# Patient Record
Sex: Male | Born: 1960
Health system: Southern US, Community
[De-identification: ages and names within clinical notes are randomized; demographics above are authoritative.]

## PROBLEM LIST (undated history)

## (undated) DIAGNOSIS — G709 Myoneural disorder, unspecified: Secondary | ICD-10-CM

## (undated) DIAGNOSIS — E785 Hyperlipidemia, unspecified: Secondary | ICD-10-CM

## (undated) DIAGNOSIS — K219 Gastro-esophageal reflux disease without esophagitis: Secondary | ICD-10-CM

## (undated) DIAGNOSIS — I10 Essential (primary) hypertension: Secondary | ICD-10-CM

## (undated) HISTORY — DX: Essential (primary) hypertension: I10

## (undated) HISTORY — DX: Gastro-esophageal reflux disease without esophagitis: K21.9

## (undated) HISTORY — DX: Hyperlipidemia, unspecified: E78.5

## (undated) HISTORY — DX: Myoneural disorder, unspecified: G70.9

---

## 2006-04-04 ENCOUNTER — Ambulatory Visit: Payer: Self-pay | Admitting: Family Medicine

## 2006-11-07 ENCOUNTER — Ambulatory Visit: Payer: Self-pay | Admitting: Family Medicine

## 2008-02-18 IMAGING — US ABDOMEN ULTRASOUND
1 series · 17 of 25 positions shown · non-contrast
Comparison: none

REASON FOR EXAM: RUQ pain nausea vomiting   CALL report  [id number]
COMMENTS:

[Series 1: abdomen ultrasound · 17 of 58 slices shown]
[im 1/58]
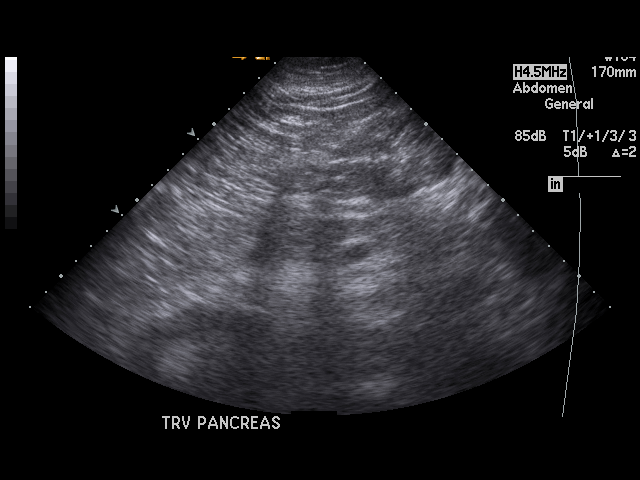
[im 5/58]
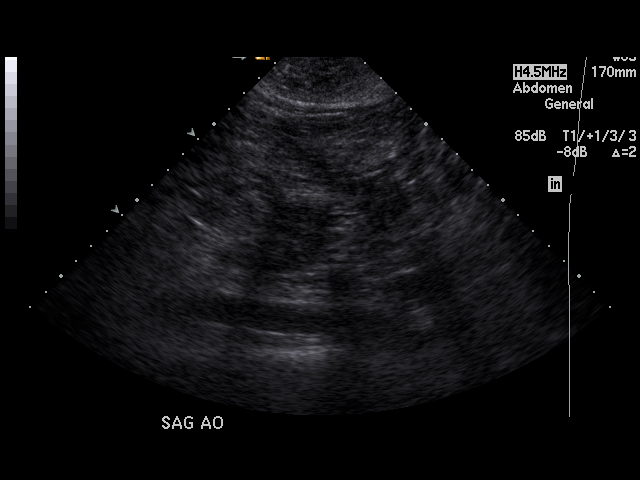
[im 8/58]
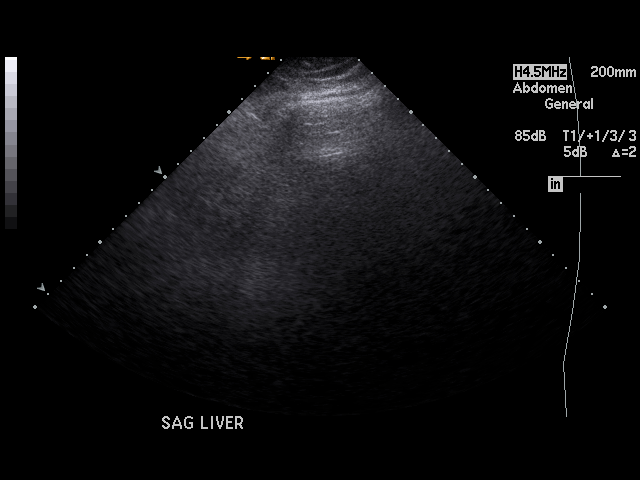
[im 12/58]
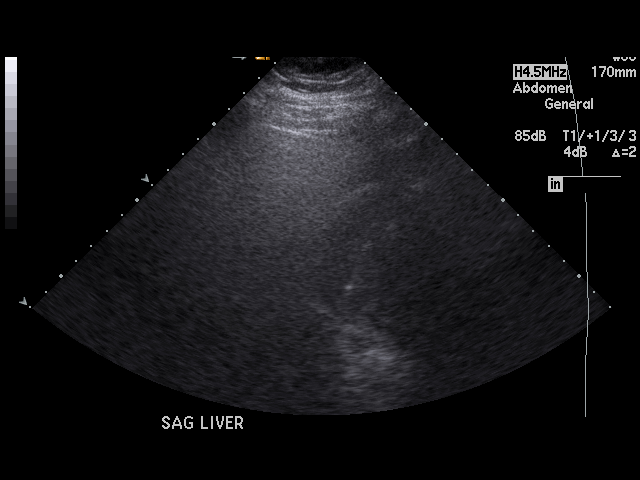
[im 15/58]
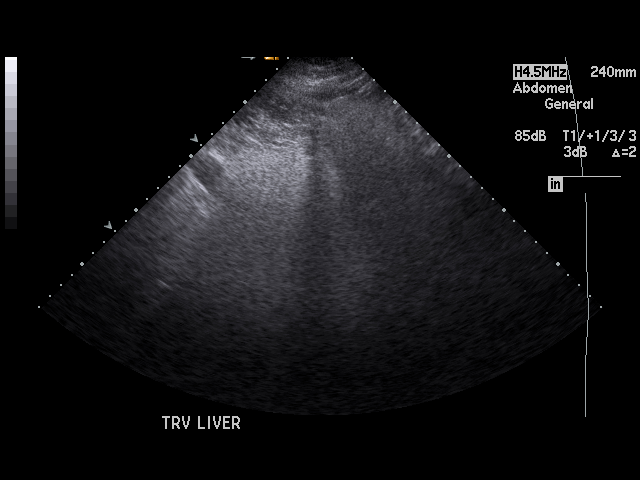
[im 20/58]
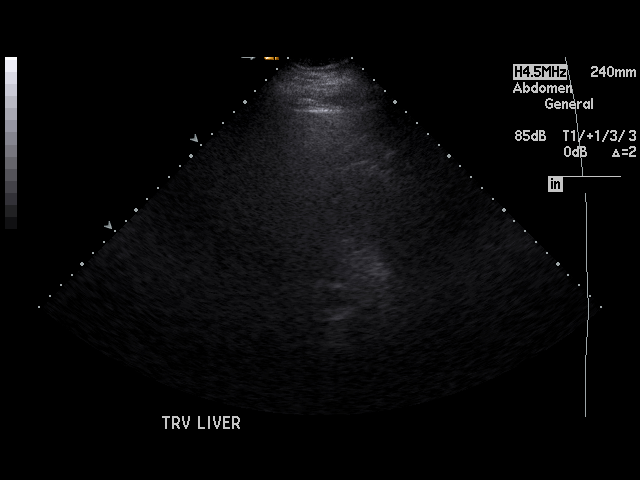
[im 22/58]
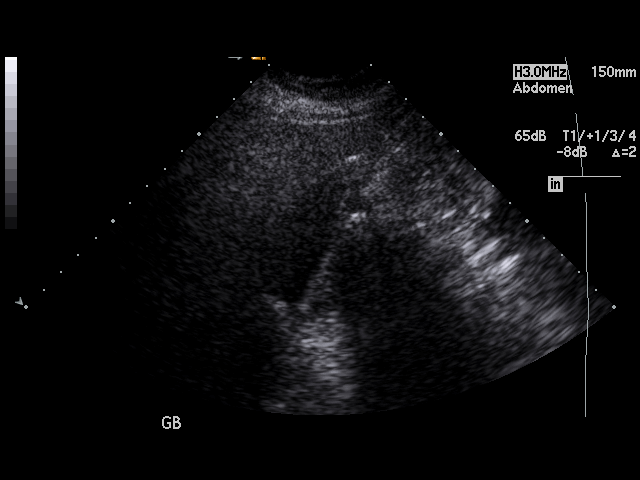
[im 27/58]
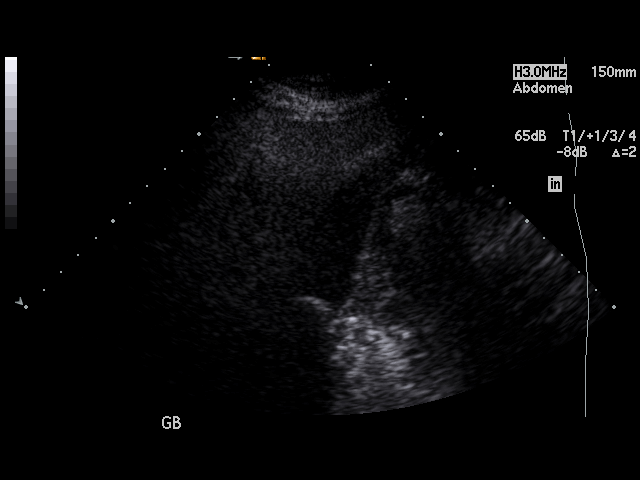
[im 29/58]
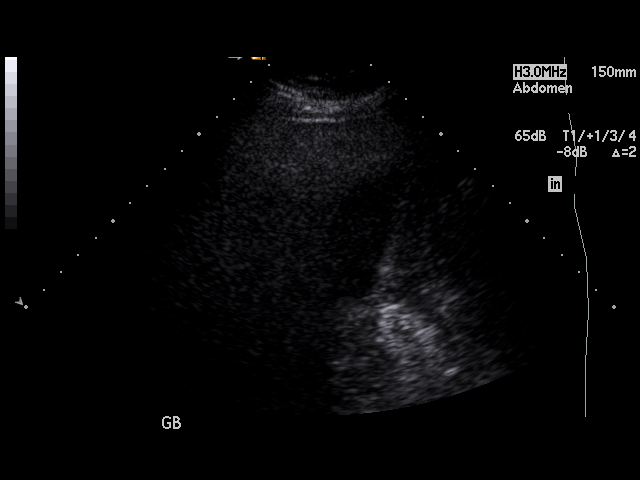
[im 31/58]
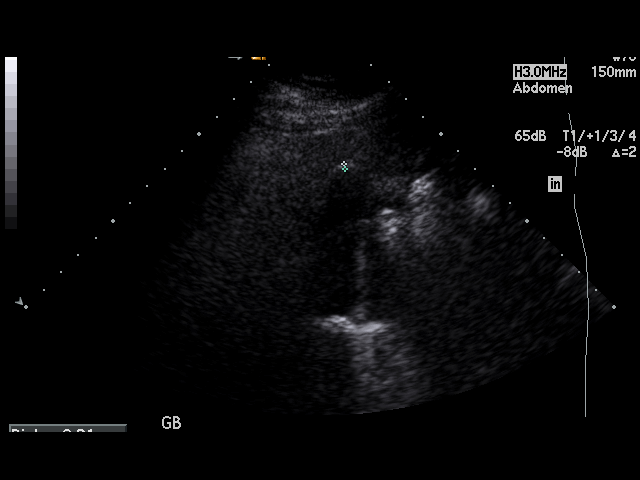
[im 36/58]
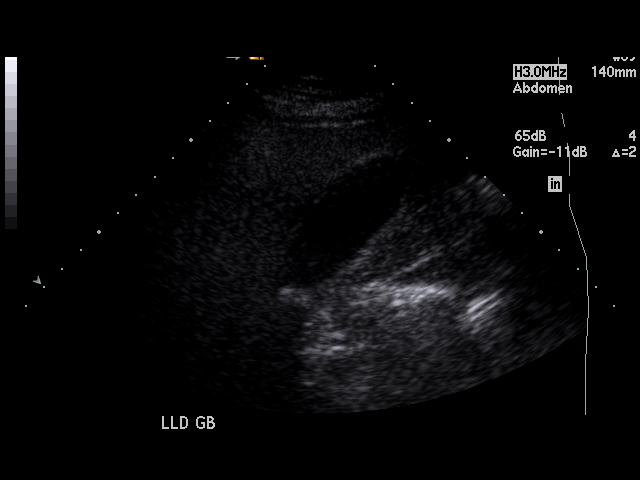
[im 39/58]
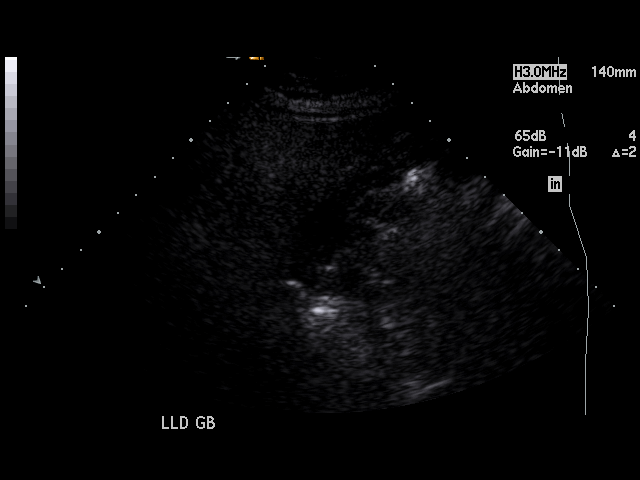
[im 43/58]
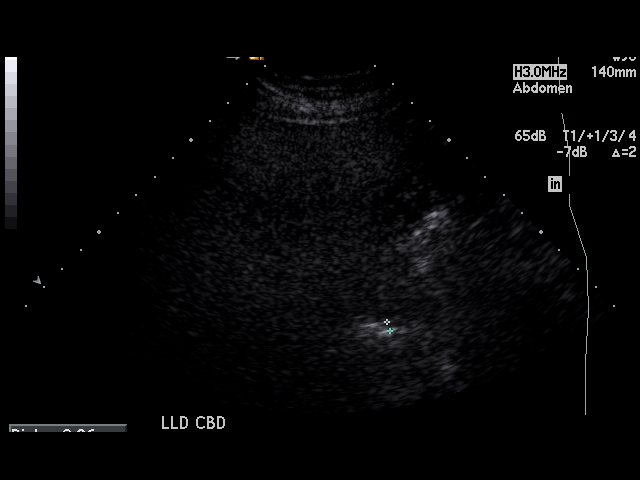
[im 46/58]
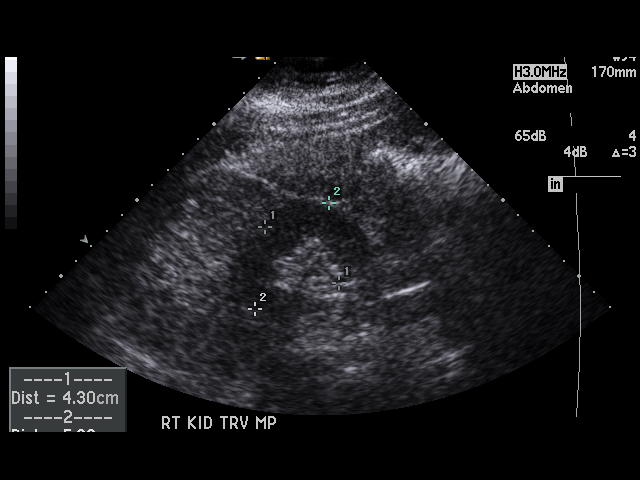
[im 50/58]
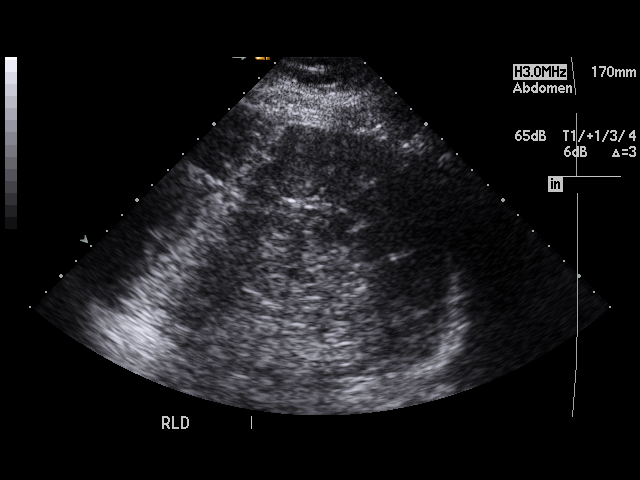
[im 53/58]
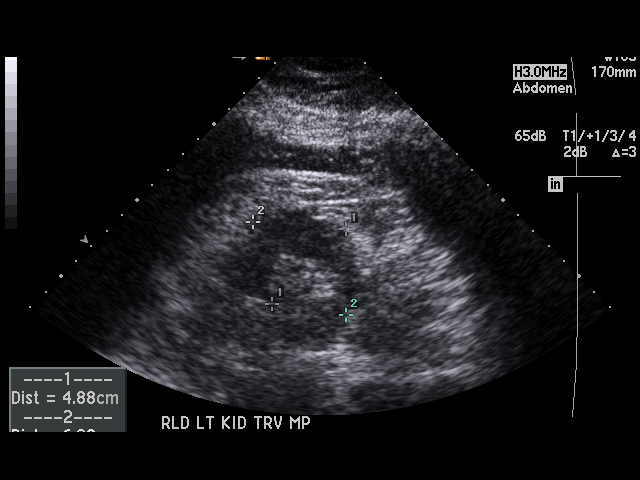
[im 58/58]
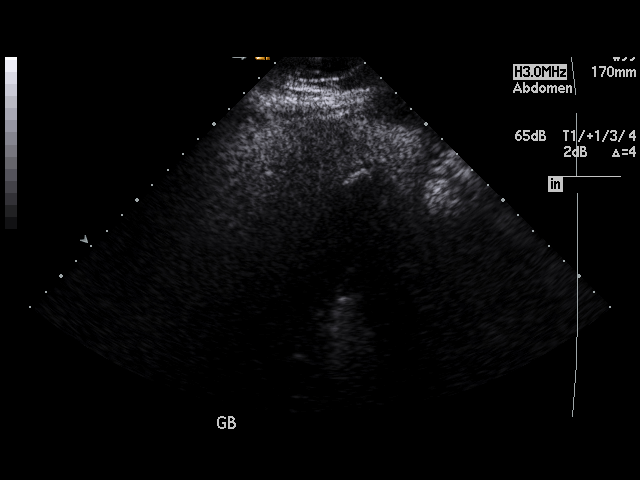

[17 of 25 positions shown; findings below may reference images not displayed]

PROCEDURE:     US  - US ABDOMEN GENERAL SURVEY  - November 07, 2006 [DATE]

RESULT:     The liver is dense, compatible with fatty infiltration. No focal
hepatic mass lesions are seen. The spleen is normal in size. The pancreas is
not visualized adequately for evaluation. The abdominal aorta is normal in
appearance. There is observed a mobile 1.0 cm echo density in the
gallbladder compatible with a gallstone. There is no thickening of the
gallbladder wall. The common bile duct measures 3.6 mm in diameter which is
within normal limits. The kidneys show no hydronephrosis. There is no
ascites.
IMPRESSION: 1. Cholelithiasis.
2. Fatty infiltration of the liver.
3. The pancreas is not visualized adequately for evaluation on this exam.

## 2010-07-20 LAB — HM COLONOSCOPY: HM COLON: ABNORMAL — AB

## 2014-08-17 ENCOUNTER — Other Ambulatory Visit: Payer: Self-pay | Admitting: Family Medicine

## 2014-11-26 LAB — PSA: PSA: 2.3

## 2015-09-09 DIAGNOSIS — I10 Essential (primary) hypertension: Secondary | ICD-10-CM | POA: Diagnosis not present

## 2015-10-10 ENCOUNTER — Encounter: Payer: Self-pay | Admitting: Family Medicine

## 2015-10-10 ENCOUNTER — Ambulatory Visit (INDEPENDENT_AMBULATORY_CARE_PROVIDER_SITE_OTHER): Payer: BLUE CROSS/BLUE SHIELD | Admitting: Family Medicine

## 2015-10-10 VITALS — BP 124/84 | HR 100 | Temp 98.9°F | Resp 18 | Ht 67.0 in | Wt 229.1 lb

## 2015-10-10 DIAGNOSIS — R739 Hyperglycemia, unspecified: Secondary | ICD-10-CM

## 2015-10-10 DIAGNOSIS — Z0000 Encounter for general adult medical examination without abnormal findings: Secondary | ICD-10-CM

## 2015-10-10 DIAGNOSIS — I10 Essential (primary) hypertension: Secondary | ICD-10-CM

## 2015-10-10 DIAGNOSIS — K760 Fatty (change of) liver, not elsewhere classified: Secondary | ICD-10-CM | POA: Diagnosis not present

## 2015-10-10 DIAGNOSIS — R809 Proteinuria, unspecified: Secondary | ICD-10-CM

## 2015-10-10 DIAGNOSIS — N400 Enlarged prostate without lower urinary tract symptoms: Secondary | ICD-10-CM | POA: Diagnosis not present

## 2015-10-10 DIAGNOSIS — E663 Overweight: Secondary | ICD-10-CM | POA: Insufficient documentation

## 2015-10-10 DIAGNOSIS — E559 Vitamin D deficiency, unspecified: Secondary | ICD-10-CM

## 2015-10-10 DIAGNOSIS — E785 Hyperlipidemia, unspecified: Secondary | ICD-10-CM

## 2015-10-10 DIAGNOSIS — L309 Dermatitis, unspecified: Secondary | ICD-10-CM | POA: Insufficient documentation

## 2015-10-10 DIAGNOSIS — Z1159 Encounter for screening for other viral diseases: Secondary | ICD-10-CM

## 2015-10-10 DIAGNOSIS — L219 Seborrheic dermatitis, unspecified: Secondary | ICD-10-CM | POA: Insufficient documentation

## 2015-10-10 DIAGNOSIS — M5090 Cervical disc disorder, unspecified, unspecified cervical region: Secondary | ICD-10-CM | POA: Insufficient documentation

## 2015-10-10 DIAGNOSIS — K8020 Calculus of gallbladder without cholecystitis without obstruction: Secondary | ICD-10-CM | POA: Insufficient documentation

## 2015-10-10 LAB — POCT UA - MICROALBUMIN: Microalbumin Ur, POC: 20 mg/L

## 2015-10-10 LAB — HEMOGLOBIN A1C
Hgb A1c MFr Bld: 6.1 % — ABNORMAL HIGH (ref <5.7)
MEAN PLASMA GLUCOSE: 128 mg/dL

## 2015-10-10 MED ORDER — ASPIRIN 81 MG PO CHEW: 81.0000 mg | CHEWABLE_TABLET | Freq: Once | ORAL | Status: DC

## 2015-10-10 MED ORDER — LISINOPRIL 10 MG PO TABS: 10.0000 mg | ORAL_TABLET | Freq: Every day | ORAL | 1 refills | Status: DC

## 2015-10-10 NOTE — Progress Notes (Signed)
Name: Melvin Williams   MRN: [id number]    DOB: 1960-09-24   Date:10/10/2015       Progress Note  Subjective  Chief Complaint  Chief Complaint  Patient presents with  . Annual Exam    HPI  Well male exam: not sexually active for the past 14 years, he has nocturia since age 55.  Hyperglycemia: he has gained a lot of weight in the past 2 years, 43 lbs, he states he likes to eat. He denies polydipsia.   HTN: he missed appointments and was without bp medication for months. His bp was going up to 180's , he went to Urgent Care and was placed back on Lisinopril - bp has been back to normal since. He was having some left side chest pain about 6 weeks ago - lasting a second, not associated with diaphoresis or SOB. Symptoms resolved since started back on bp medication. He does not want to see a cardiologist at this time. He wants to hold off on eKG also  Obesity: he was very strict with his diet when he was having abdominal pain from cholelithiasis years ago, however he has gained 43 lbs since. Discussed importance of life style modification.    Patient Active Problem List   Diagnosis Date Noted  . Hypertension, benign 10/10/2015  . Hyperlipidemia 10/10/2015  . Hyperglycemia 10/10/2015  . Vitamin D deficiency 10/10/2015  . Microalbuminuria 10/10/2015  . Morbid obesity ([place]) 10/10/2015  . Eczema 10/10/2015  . Seborrhea 10/10/2015  . Fatty liver 10/10/2015  . Cholelithiasis 10/10/2015  . Cervical disc disease 10/10/2015    History reviewed. No pertinent surgical history.  Family History  Problem Relation Age of Onset  . Cancer Mother   . Heart disease Father     Social History   Social History  . Marital status: Single    Spouse name: N/A  . Number of children: N/A  . Years of education: N/A   Occupational History  . Not on file.   Social History Main Topics  . Smoking status: Never Smoker  . Smokeless tobacco: Never Used  . Alcohol use No  . Drug use: No  . Sexual  activity: No   Other Topics Concern  . Not on file   Social History Narrative  . No narrative on file     Current Outpatient Prescriptions:  .  cholecalciferol (VITAMIN D) 1000 units tablet, Take 1,000 Units by mouth daily., Disp: , Rfl:  .  aspirin EC 81 MG tablet, Take by mouth., Disp: , Rfl:  .  lisinopril (PRINIVIL,ZESTRIL) 10 MG tablet, Take 1 tablet (10 mg total) by mouth daily., Disp: 90 tablet, Rfl: 1  No Known Allergies   ROS  Constitutional: Negative for fever , positive for weight change.  Respiratory: Negative for cough and shortness of breath.   Cardiovascular: Negative for chest pain or palpitations. He states he was having a brief ( like a second- like a twinge on his chest ) Gastrointestinal: Negative for abdominal pain, no bowel changes.  Musculoskeletal: Negative for gait problem or joint swelling.  Skin: positive  for rash.  Neurological: Negative for dizziness or headache.  No other specific complaints in a complete review of systems (except as listed in HPI above).  Objective  Vitals:   10/10/15 1114  BP: 124/84  Pulse: 100  Resp: 18  Temp: 98.9 F (37.2 C)  SpO2: 98%  Weight: 229 lb 1 oz (103.9 kg)  Height: 5\' 7"  (1.702 m)  Body mass index is 35.88 kg/m.  Physical Exam  Constitutional: Patient appears well-developed and well-nourished. No distress.  HENT: Head: Normocephalic and atraumatic. Ears: B TMs ok, no erythema or effusion; Nose: Nose normal. Mouth/Throat: Oropharynx is clear and moist. No oropharyngeal exudate.  Eyes: Conjunctivae and EOM are normal. Pupils are equal, round, and reactive to light. No scleral icterus.  Neck: Normal range of motion. Neck supple. No JVD present. No thyromegaly present.  Cardiovascular: Normal rate, regular rhythm and normal heart sounds.  No murmur heard. No BLE edema. Pulmonary/Chest: Effort normal and breath sounds normal. No respiratory distress. Abdominal: Soft. Bowel sounds are normal, no  distension. There is no tenderness. no masses MALE GENITALIA: Normal descended testes bilaterally, no masses palpated, no hernias, no lesions, no discharge RECTAL: Prostate enlarged in  size normal  consistency, no rectal masses or hemorrhoids Musculoskeletal: Normal range of motion, no joint effusions. No gross deformities Neurological: he is alert and oriented to person, place, and time. No cranial nerve deficit. Coordination, balance, strength, speech and gait are normal.  Skin: Skin is warm and dry. No rash noted. No erythema.  Psychiatric: Patient has a normal mood and affect. behavior is normal. Judgment and thought content normal.  PHQ2/9: Depression screen [facility] 2/9 10/10/2015  Decreased Interest 0  Down, Depressed, Hopeless 0  PHQ - 2 Score 0  Altered sleeping 3  Tired, decreased energy 1  Change in appetite 3  Feeling bad or failure about yourself  0  Trouble concentrating 0  Moving slowly or fidgety/restless 0  Suicidal thoughts 0  PHQ-9 Score 7     Functional Status Survey: Is the patient deaf or have difficulty hearing?: No Does the patient have difficulty seeing, even when wearing glasses/contacts?: No Does the patient have difficulty concentrating, remembering, or making decisions?: No Does the patient have difficulty walking or climbing stairs?: No Does the patient have difficulty dressing or bathing?: No Does the patient have difficulty doing errands alone such as visiting a doctor's office or shopping?: No  IPSS Questionnaire (AUA-7): Over the past month.   1)  How often have you had a sensation of not emptying your bladder completely after you finish urinating?  5 - Almost always  2)  How often have you had to urinate again less than two hours after you finished urinating? 1 - Less than 1 time in 5  3)  How often have you found you stopped and started again several times when you urinated?  5 - Almost always  4) How difficult have you found it to postpone  urination?  2 - Less than half the time  5) How often have you had a weak urinary stream?  5 - Almost always  6) How often have you had to push or strain to begin urination?  0 - Not at all  7) How many times did you most typically get up to urinate from the time you went to bed until the time you got up in the morning?  2 - 2 times  Total score:  0-7 mildly symptomatic   8-19 moderately symptomatic   20-35 severely symptomatic     Assessment & Plan  1. Encounter for routine history and physical exam for male  Discussed importance of 150 minutes of physical activity weekly, eat two servings of fish weekly, eat one serving of tree nuts ( cashews, pistachios, pecans, almonds.Marland Kitchen) every other day, eat 6 servings of fruit/vegetables daily and drink plenty of water and avoid sweet  beverages.   2. Hypertension, benign  - lisinopril (PRINIVIL,ZESTRIL) 10 MG tablet; Take 1 tablet (10 mg total) by mouth daily.  Dispense: 90 tablet; Refill: 1 - COMPLETE METABOLIC PANEL WITH GFR  3. Hyperlipidemia  - Lipid panel  4. Hyperglycemia  - COMPLETE METABOLIC PANEL WITH GFR - Hemoglobin A1c  5. Vitamin D deficiency  Continue vitamin D supplementation   6. Microalbuminuria  - POCT UA - Microalbumin  7. Morbid obesity, unspecified obesity type [facility])  Discussed with the patient the risk posed by an increased BMI. Discussed importance of portion control, calorie counting and at least 150 minutes of physical activity weekly. Avoid sweet beverages and drink more water. Eat at least 6 servings of fruit and vegetables daily   8. Fatty liver  - COMPLETE METABOLIC PANEL WITH GFR  9. BPH (benign prostatic hyperplasia)  AUA is high, but he would like to hold off on medication, states it does not bother him, discussed symptoms of urinary retention

## 2015-10-11 LAB — LIPID PANEL
Cholesterol: 207 mg/dL — ABNORMAL HIGH (ref 125–200)
HDL: 61 mg/dL (ref >=40)
LDL CALC: 107 mg/dL (ref <130)
Total CHOL/HDL Ratio: 3.4 Ratio (ref <=5.0)
Triglycerides: 194 mg/dL — ABNORMAL HIGH (ref <150)
VLDL: 39 mg/dL — AB (ref <30)

## 2015-10-11 LAB — COMPLETE METABOLIC PANEL WITH GFR
ALT: 29 U/L (ref 9–46)
AST: 21 U/L (ref 10–35)
Albumin: 4.6 g/dL (ref 3.6–5.1)
Alkaline Phosphatase: 82 U/L (ref 40–115)
BUN: 11 mg/dL (ref 7–25)
CHLORIDE: 103 mmol/L (ref 98–110)
CO2: 26 mmol/L (ref 20–31)
CREATININE: 0.85 mg/dL (ref 0.70–1.33)
Calcium: 10.1 mg/dL (ref 8.6–10.3)
GFR, Est African American: >89 mL/min (ref >=60)
GFR, Est Non African American: >89 mL/min (ref >=60)
Glucose, Bld: 82 mg/dL (ref 65–99)
Potassium: 5 mmol/L (ref 3.5–5.3)
Sodium: 141 mmol/L (ref 135–146)
TOTAL PROTEIN: 7.4 g/dL (ref 6.1–8.1)
Total Bilirubin: 0.7 mg/dL (ref 0.2–1.2)

## 2015-10-11 LAB — HEPATITIS C ANTIBODY: HCV AB: NEGATIVE

## 2015-10-12 ENCOUNTER — Telehealth: Payer: Self-pay

## 2015-10-12 NOTE — Telephone Encounter (Signed)
Left message for patient to call us back regarding lab work.

## 2015-10-12 NOTE — Telephone Encounter (Signed)
-----   Message from Steele Sizer, MD sent at 10/11/2015  6:01 PM EDT ----- Lipid panel shows elevated triglycerides and he needs to resume a healthy diet and try to lose weight Sugar, kidney and transaminases are within normal limits hgbA1C shows pre-diabetes, and needs to try to follow a diabetic diet Hepatitis C screen negative

## 2015-12-01 LAB — PSA: PSA: 2.9

## 2015-12-09 ENCOUNTER — Encounter: Payer: Self-pay | Admitting: Family Medicine

## 2015-12-09 ENCOUNTER — Ambulatory Visit (INDEPENDENT_AMBULATORY_CARE_PROVIDER_SITE_OTHER): Payer: BLUE CROSS/BLUE SHIELD | Admitting: Family Medicine

## 2015-12-09 VITALS — BP 124/82 | HR 99 | Temp 98.5°F | Resp 16 | Ht 68.0 in | Wt 209.6 lb

## 2015-12-09 DIAGNOSIS — R739 Hyperglycemia, unspecified: Secondary | ICD-10-CM | POA: Diagnosis not present

## 2015-12-09 DIAGNOSIS — R972 Elevated prostate specific antigen [PSA]: Secondary | ICD-10-CM | POA: Diagnosis not present

## 2015-12-09 DIAGNOSIS — E785 Hyperlipidemia, unspecified: Secondary | ICD-10-CM | POA: Diagnosis not present

## 2015-12-09 DIAGNOSIS — N400 Enlarged prostate without lower urinary tract symptoms: Secondary | ICD-10-CM | POA: Diagnosis not present

## 2015-12-09 NOTE — Progress Notes (Signed)
Name: Melvin Williams   MRN: [id number]    DOB: 1960/07/08   Date:12/09/2015       Progress Note  Subjective  Chief Complaint  Chief Complaint  Patient presents with  . Discuss Bloodwork    Patient had his PSA check at his work wellness screening, and they told him to be checked by his PCP due to his PSA elevating over the past couple of years. PSA last year was 2.3    HPI  PSA elevation: he had PSA done at work and it was 2.9, last year it was 2.3 and the year prior 2.6. He has symptoms of BPH for about 15 years but no changes in symptoms. He denies family history of prostate cancer. He came in because the lab was done at work and he was advised to follow up with his pcp.   Pre-diabetes: he had gained a lot of weight and hgbA1C went up to 6.1% back in July - he has changed his diet, avoiding carbohydrates, only bread is flat bread and english muffins. He has lost 20 lbs since July and is feeling well. No polyphagia, polydipsia or polyuria  Dyslipidemia: LDL was at goal, but triglycerides was elevated, but he has changed his diet, has lost weight, he has not changed his exercise regiment, but has been more active at work   Patient Active Problem List   Diagnosis Date Noted  . Hypertension, benign 10/10/2015  . Hyperlipidemia 10/10/2015  . Hyperglycemia 10/10/2015  . Vitamin D deficiency 10/10/2015  . Microalbuminuria 10/10/2015  . Morbid obesity ([place]) 10/10/2015  . Eczema 10/10/2015  . Seborrhea 10/10/2015  . Fatty liver 10/10/2015  . Cholelithiasis 10/10/2015  . Cervical disc disease 10/10/2015    History reviewed. No pertinent surgical history.  Family History  Problem Relation Age of Onset  . Cancer Mother   . Heart disease Father     Social History   Social History  . Marital status: Single    Spouse name: N/A  . Number of children: N/A  . Years of education: N/A   Occupational History  . Not on file.   Social History Main Topics  . Smoking status: Never  Smoker  . Smokeless tobacco: Never Used  . Alcohol use No  . Drug use: No  . Sexual activity: No   Other Topics Concern  . Not on file   Social History Narrative  . No narrative on file     Current Outpatient Prescriptions:  .  aspirin EC 81 MG tablet, Take by mouth., Disp: , Rfl:  .  cholecalciferol (VITAMIN D) 1000 units tablet, Take 1,000 Units by mouth daily., Disp: , Rfl:  .  lisinopril (PRINIVIL,ZESTRIL) 10 MG tablet, Take 1 tablet (10 mg total) by mouth daily., Disp: 90 tablet, Rfl: 1  No Known Allergies   ROS  Ten systems reviewed and is negative except as mentioned in HPI   Objective  Vitals:   12/09/15 0828  BP: 124/82  Pulse: 99  Resp: 16  Temp: 98.5 F (36.9 C)  TempSrc: Oral  SpO2: 97%  Weight: 209 lb 9.6 oz (95.1 kg)  Height: 5\' 8"  (1.727 m)    Body mass index is 31.87 kg/m.  Physical Exam  Constitutional: Patient appears well-developed and well-nourished. Obese No distress.  HEENT: head atraumatic, normocephalic, pupils equal and reactive to light, neck supple, throat within normal limits Cardiovascular: Normal rate, regular rhythm and normal heart sounds.  No murmur heard. No BLE edema. Pulmonary/Chest: Effort  normal and breath sounds normal. No respiratory distress. Abdominal: Soft.  There is no tenderness. Psychiatric: Patient has a normal mood and affect. behavior is normal. Judgment and thought content normal.  Recent Results (from the past 2160 hour(s))  Hepatitis C Antibody     Status: None   Collection Time: 10/10/15 12:23 PM  Result Value Ref Range   HCV Ab NEGATIVE NEGATIVE  POCT UA - Microalbumin     Status: None   Collection Time: 10/10/15 12:29 PM  Result Value Ref Range   Microalbumin Ur, POC 20 mg/L   Creatinine, POC  mg/dL   Albumin/Creatinine Ratio, Urine, POC    Lipid panel     Status: Abnormal   Collection Time: 10/10/15  1:15 PM  Result Value Ref Range   Cholesterol 207 (H) 125 - 200 mg/dL   Triglycerides 194 (H)  <150 mg/dL   HDL 61 >=40 mg/dL   Total CHOL/HDL Ratio 3.4 <=5.0 Ratio   VLDL 39 (H) <30 mg/dL   LDL Cholesterol 107 <130 mg/dL    Comment:   Total Cholesterol/HDL Ratio:CHD Risk                        Coronary Heart Disease Risk Table                                        Men       Women          1/2 Average Risk              3.4        3.3              Average Risk              5.0        4.4           2X Average Risk              9.6        7.1           3X Average Risk             23.4       11.0 Use the calculated Patient Ratio above and the CHD Risk table  to determine the patient's CHD Risk.   COMPLETE METABOLIC PANEL WITH GFR     Status: None   Collection Time: 10/10/15  1:15 PM  Result Value Ref Range   Sodium 141 135 - 146 mmol/L   Potassium 5.0 3.5 - 5.3 mmol/L   Chloride 103 98 - 110 mmol/L   CO2 26 20 - 31 mmol/L   Glucose, Bld 82 65 - 99 mg/dL   BUN 11 7 - 25 mg/dL   Creat 0.85 0.70 - 1.33 mg/dL    Comment:   For patients > or = 56 years of age: The upper reference limit for Creatinine is approximately 13% higher for people identified as African-American.      Total Bilirubin 0.7 0.2 - 1.2 mg/dL   Alkaline Phosphatase 82 40 - 115 U/L   AST 21 10 - 35 U/L   ALT 29 9 - 46 U/L   Total Protein 7.4 6.1 - 8.1 g/dL   Albumin 4.6 3.6 - 5.1 g/dL   Calcium 10.1 8.6 - 10.3 mg/dL   GFR, Est African  American >89 >=60 mL/min   GFR, Est Non African American >89 >=60 mL/min  Hemoglobin A1c     Status: Abnormal   Collection Time: 10/10/15  1:15 PM  Result Value Ref Range   Hgb A1c MFr Bld 6.1 (H) <5.7 %    Comment:   For someone without known diabetes, a hemoglobin A1c value between 5.7% and 6.4% is consistent with prediabetes and should be confirmed with a follow-up test.   For someone with known diabetes, a value <7% indicates that their diabetes is well controlled. A1c targets should be individualized based on duration of diabetes, age, co-morbid conditions and  other considerations.   This assay result is consistent with an increased risk of diabetes.   Currently, no consensus exists regarding use of hemoglobin A1c for diagnosis of diabetes in children.      Mean Plasma Glucose 128 mg/dL      PHQ2/9: Depression screen [facility] 2/9 10/10/2015  Decreased Interest 0  Down, Depressed, Hopeless 0  PHQ - 2 Score 0  Altered sleeping 3  Tired, decreased energy 1  Change in appetite 3  Feeling bad or failure about yourself  0  Trouble concentrating 0  Moving slowly or fidgety/restless 0  Suicidal thoughts 0  PHQ-9 Score 7   Altered sleep secondary to nocturia, but he does not want medication at this time  Assessment & Plan  1. PSA elevation  Discussed options, seeing Urologist now or repeating in one month and if stable we will keep monitoring.  - PSA  2. BPH (benign prostatic hyperplasia)  stable - PSA  3. Hyperglycemia  He has changed his diet again, and lost 20 lbs since last visit, feeling well, continue the hard work   4. Hyperlipidemia  On dietary modification

## 2015-12-15 DIAGNOSIS — Z23 Encounter for immunization: Secondary | ICD-10-CM | POA: Diagnosis not present

## 2016-01-04 ENCOUNTER — Other Ambulatory Visit: Payer: Self-pay | Admitting: Family Medicine

## 2016-01-04 DIAGNOSIS — N400 Enlarged prostate without lower urinary tract symptoms: Secondary | ICD-10-CM | POA: Diagnosis not present

## 2016-01-04 DIAGNOSIS — R972 Elevated prostate specific antigen [PSA]: Secondary | ICD-10-CM | POA: Diagnosis not present

## 2016-01-04 LAB — PSA: PSA: 2.7 ng/mL (ref <=4.0)

## 2016-04-11 ENCOUNTER — Ambulatory Visit (INDEPENDENT_AMBULATORY_CARE_PROVIDER_SITE_OTHER): Payer: BLUE CROSS/BLUE SHIELD | Admitting: Family Medicine

## 2016-04-11 ENCOUNTER — Encounter: Payer: Self-pay | Admitting: Family Medicine

## 2016-04-11 VITALS — BP 118/76 | HR 81 | Temp 98.0°F | Resp 16 | Ht 68.0 in | Wt 184.9 lb

## 2016-04-11 DIAGNOSIS — R972 Elevated prostate specific antigen [PSA]: Secondary | ICD-10-CM | POA: Diagnosis not present

## 2016-04-11 DIAGNOSIS — K760 Fatty (change of) liver, not elsewhere classified: Secondary | ICD-10-CM

## 2016-04-11 DIAGNOSIS — R7303 Prediabetes: Secondary | ICD-10-CM

## 2016-04-11 DIAGNOSIS — R809 Proteinuria, unspecified: Secondary | ICD-10-CM

## 2016-04-11 DIAGNOSIS — N400 Enlarged prostate without lower urinary tract symptoms: Secondary | ICD-10-CM | POA: Insufficient documentation

## 2016-04-11 DIAGNOSIS — E559 Vitamin D deficiency, unspecified: Secondary | ICD-10-CM | POA: Diagnosis not present

## 2016-04-11 DIAGNOSIS — I10 Essential (primary) hypertension: Secondary | ICD-10-CM | POA: Diagnosis not present

## 2016-04-11 DIAGNOSIS — E782 Mixed hyperlipidemia: Secondary | ICD-10-CM | POA: Diagnosis not present

## 2016-04-11 DIAGNOSIS — E663 Overweight: Secondary | ICD-10-CM

## 2016-04-11 DIAGNOSIS — N401 Enlarged prostate with lower urinary tract symptoms: Secondary | ICD-10-CM | POA: Diagnosis not present

## 2016-04-11 LAB — COMPLETE METABOLIC PANEL WITH GFR
ALT: 36 U/L (ref 9–46)
AST: 24 U/L (ref 10–35)
Albumin: 4.3 g/dL (ref 3.6–5.1)
Alkaline Phosphatase: 76 U/L (ref 40–115)
BILIRUBIN TOTAL: 0.8 mg/dL (ref 0.2–1.2)
BUN: 15 mg/dL (ref 7–25)
CALCIUM: 9.5 mg/dL (ref 8.6–10.3)
CO2: 27 mmol/L (ref 20–31)
CREATININE: 0.76 mg/dL (ref 0.70–1.33)
Chloride: 104 mmol/L (ref 98–110)
Glucose, Bld: 92 mg/dL (ref 65–99)
Potassium: 5.2 mmol/L (ref 3.5–5.3)
Sodium: 139 mmol/L (ref 135–146)
TOTAL PROTEIN: 7.1 g/dL (ref 6.1–8.1)

## 2016-04-11 LAB — LIPID PANEL
CHOLESTEROL: 188 mg/dL (ref <200)
HDL: 56 mg/dL (ref >40)
LDL CALC: 119 mg/dL — AB (ref <100)
Total CHOL/HDL Ratio: 3.4 Ratio (ref <5.0)
Triglycerides: 65 mg/dL (ref <150)
VLDL: 13 mg/dL (ref <30)

## 2016-04-11 LAB — HEMOGLOBIN A1C
HEMOGLOBIN A1C: 5.5 % (ref <5.7)
MEAN PLASMA GLUCOSE: 111 mg/dL

## 2016-04-11 LAB — PSA: PSA: 2.4 ng/mL (ref <=4.0)

## 2016-04-11 MED ORDER — TAMSULOSIN HCL 0.4 MG PO CAPS: 0.4000 mg | ORAL_CAPSULE | Freq: Every day | ORAL | 5 refills | Status: DC

## 2016-04-11 NOTE — Progress Notes (Signed)
Name: Melvin Williams   MRN: [id number]    DOB: Jan 11, 1961   Date:04/11/2016       Progress Note  Subjective  Chief Complaint  Chief Complaint  Patient presents with  . Follow-up  . Prediabetes    patient will get the nurse at work to check it  . Hypertension    controlled with medication  . Obesity    patient was 229 in 09/2015, then dropped to 210 in 11/2015  . Dyslipidemia  . Elevated PSA    HPI  PSA elevation: he had PSA done at work and it was 2.9, last year it was 2.3 and the year prior 2.6. He has symptoms of BPH for about 15 years but no changes in symptoms. He denies family history of prostate cancer.   IPSS Questionnaire (AUA-7): Over the past month.   1)  How often have you had a sensation of not emptying your bladder completely after you finish urinating?  2 - Less than half the time  2)  How often have you had to urinate again less than two hours after you finished urinating? 2 - Less than half the time  3)  How often have you found you stopped and started again several times when you urinated?  1 - Less than 1 time in 5  4) How difficult have you found it to postpone urination?  5 - Almost always  5) How often have you had a weak urinary stream?  4 - More than half the time  6) How often have you had to push or strain to begin urination?  0 - Not at all  7) How many times did you most typically get up to urinate from the time you went to bed until the time you got up in the morning?  2 - 2 times  Total score:  0-7 mildly symptomatic   8-19 moderately symptomatic   20-35 severely symptomatic     Pre-diabetes: he had gained a lot of weight and hgbA1C went up to 6.1% back in July  - he has changed his diet, avoiding carbohydrates, only bread is flat bread and english muffins. He has lost 20 lbs since July and another 25 lbs since last visit in 09/2017and is feeling well. No polyphagia, polydipsia or polyuria  Dyslipidemia: LDL was at goal, but triglycerides was  elevated, but he has changed his diet, has lost weight, he has not changed his exercise regiment, but has been more active at work , we will recheck lab work     Patient Active Problem List   Diagnosis Date Noted  . Benign prostatic hyperplasia without lower urinary tract symptoms 04/11/2016  . PSA elevation 04/11/2016  . Pre-diabetes 04/11/2016  . Hypertension, benign 10/10/2015  . Hyperlipidemia 10/10/2015  . Hyperglycemia 10/10/2015  . Vitamin D deficiency 10/10/2015  . Microalbuminuria 10/10/2015  . Overweight (BMI 25.0-29.9) 10/10/2015  . Eczema 10/10/2015  . Seborrhea 10/10/2015  . Fatty liver 10/10/2015  . Cholelithiasis 10/10/2015  . Cervical disc disease 10/10/2015    History reviewed. No pertinent surgical history.  Family History  Problem Relation Age of Onset  . Cancer Mother   . Heart disease Father     Social History   Social History  . Marital status: Single    Spouse name: N/A  . Number of children: N/A  . Years of education: N/A   Occupational History  . Not on file.   Social History Main Topics  . Smoking status:  Never Smoker  . Smokeless tobacco: Never Used  . Alcohol use No  . Drug use: No  . Sexual activity: No   Other Topics Concern  . Not on file   Social History Narrative  . No narrative on file     Current Outpatient Prescriptions:  .  aspirin EC 81 MG tablet, Take by mouth., Disp: , Rfl:  .  cholecalciferol (VITAMIN D) 1000 units tablet, Take 1,000 Units by mouth daily., Disp: , Rfl:  .  ibuprofen (ADVIL,MOTRIN) 200 MG tablet, Take 200 mg by mouth as needed., Disp: , Rfl:  .  lisinopril (PRINIVIL,ZESTRIL) 10 MG tablet, Take 1 tablet (10 mg total) by mouth daily., Disp: 90 tablet, Rfl: 1  No Known Allergies   ROS  Constitutional: Negative for fever, positive for weight change - he change his diet .  Respiratory: Negative for cough and shortness of breath.   Cardiovascular: Negative for chest pain or palpitations.   Gastrointestinal: Negative for abdominal pain, no bowel changes.  Musculoskeletal: Negative for gait problem or joint swelling.  Skin: Negative for rash.  Neurological: Negative for dizziness or headache.  No other specific complaints in a complete review of systems (except as listed in HPI above).  Objective  Vitals:   04/11/16 0839  BP: 118/76  Pulse: 81  Resp: 16  Temp: 98 F (36.7 C)  TempSrc: Oral  SpO2: 98%  Weight: 184 lb 14.4 oz (83.9 kg)  Height: 5\' 8"  (1.727 m)    Body mass index is 28.11 kg/m.  Physical Exam  Constitutional: Patient appears well-developed and well-nourished. Overweight. No distress.  HEENT: head atraumatic, normocephalic, pupils equal and reactive to light, neck supple, throat within normal limits Cardiovascular: Normal rate, regular rhythm and normal heart sounds.  No murmur heard. No BLE edema. Pulmonary/Chest: Effort normal and breath sounds normal. No respiratory distress. Abdominal: Soft.  There is no tenderness. Psychiatric: Patient has a normal mood and affect. behavior is normal. Judgment and thought content normal.  PHQ2/9: Depression screen [facility] 2/9 04/11/2016 10/10/2015  Decreased Interest 0 0  Down, Depressed, Hopeless 0 0  PHQ - 2 Score 0 0  Altered sleeping - 3  Tired, decreased energy - 1  Change in appetite - 3  Feeling bad or failure about yourself  - 0  Trouble concentrating - 0  Moving slowly or fidgety/restless - 0  Suicidal thoughts - 0  PHQ-9 Score - 7    Fall Risk: Fall Risk  04/11/2016  Falls in the past year? No     Functional Status Survey: Is the patient deaf or have difficulty hearing?: No Does the patient have difficulty seeing, even when wearing glasses/contacts?: Yes (patient has not had an eye exam in 7 yrs.) Does the patient have difficulty concentrating, remembering, or making decisions?: No Does the patient have difficulty walking or climbing stairs?: No Does the patient have difficulty dressing or  bathing?: No Does the patient have difficulty doing errands alone such as visiting a doctor's office or shopping?: No    Assessment & Plan  1. Hypertension, benign  - COMPLETE METABOLIC PANEL WITH GFR  2. Vitamin D deficiency  Taking supplementation  3. Mixed hyperlipidemia  - Lipid panel  4. Fatty liver  -comp panel   5. PSA elevation  - PSA - tamsulosin (FLOMAX) 0.4 MG CAPS capsule; Take 1 capsule (0.4 mg total) by mouth daily.  Dispense: 30 capsule; Refill: 5   6. Benign prostatic hyperplasia withlower urinary tract symptoms  Discussed  medication and he would like to try Flomax  7. Microalbuminuria  Back to normal 09/2015, bp is at goal, he will stop lisinopril about 1-2 weeks prior to next visit and we will recheck bp and urine micro at the time  8. Pre-diabetes  - Hemoglobin A1c - Insulin, fasting  9. Overweight (BMI 25.0-29.9)  - Insulin, fasting

## 2016-04-12 LAB — INSULIN, FASTING: INSULIN FASTING, SERUM: 3.5 u[IU]/mL (ref 2.0–19.6)

## 2016-05-17 ENCOUNTER — Other Ambulatory Visit: Payer: Self-pay | Admitting: Family Medicine

## 2016-05-17 DIAGNOSIS — I10 Essential (primary) hypertension: Secondary | ICD-10-CM

## 2016-05-17 NOTE — Telephone Encounter (Signed)
Patient requesting refill of Lisinopril to [facility].  

## 2016-10-02 ENCOUNTER — Other Ambulatory Visit: Payer: Self-pay | Admitting: Family Medicine

## 2016-10-02 DIAGNOSIS — N401 Enlarged prostate with lower urinary tract symptoms: Secondary | ICD-10-CM

## 2016-10-02 NOTE — Telephone Encounter (Signed)
Patient requesting refill of Flomax to [facility].

## 2016-10-11 ENCOUNTER — Encounter: Payer: Self-pay | Admitting: Family Medicine

## 2016-10-11 ENCOUNTER — Ambulatory Visit (INDEPENDENT_AMBULATORY_CARE_PROVIDER_SITE_OTHER): Payer: BLUE CROSS/BLUE SHIELD | Admitting: Family Medicine

## 2016-10-11 VITALS — BP 122/68 | HR 65 | Temp 98.4°F | Resp 18 | Ht 67.3 in | Wt 167.1 lb

## 2016-10-11 DIAGNOSIS — N401 Enlarged prostate with lower urinary tract symptoms: Secondary | ICD-10-CM

## 2016-10-11 DIAGNOSIS — R7303 Prediabetes: Secondary | ICD-10-CM | POA: Diagnosis not present

## 2016-10-11 DIAGNOSIS — R972 Elevated prostate specific antigen [PSA]: Secondary | ICD-10-CM

## 2016-10-11 DIAGNOSIS — Z0000 Encounter for general adult medical examination without abnormal findings: Secondary | ICD-10-CM | POA: Diagnosis not present

## 2016-10-11 DIAGNOSIS — Z13220 Encounter for screening for lipoid disorders: Secondary | ICD-10-CM

## 2016-10-11 DIAGNOSIS — E559 Vitamin D deficiency, unspecified: Secondary | ICD-10-CM

## 2016-10-11 DIAGNOSIS — I10 Essential (primary) hypertension: Secondary | ICD-10-CM | POA: Diagnosis not present

## 2016-10-11 LAB — CBC WITH DIFFERENTIAL/PLATELET
Basophils Absolute: 0 cells/uL (ref 0–200)
Basophils Relative: 0 %
EOS ABS: 53 {cells}/uL (ref 15–500)
Eosinophils Relative: 1 %
HCT: 41.2 % (ref 38.5–50.0)
Hemoglobin: 13.4 g/dL (ref 13.2–17.1)
Lymphocytes Relative: 35 %
Lymphs Abs: 1855 cells/uL (ref 850–3900)
MCH: 28.8 pg (ref 27.0–33.0)
MCHC: 32.5 g/dL (ref 32.0–36.0)
MCV: 88.4 fL (ref 80.0–100.0)
MONO ABS: 265 {cells}/uL (ref 200–950)
MPV: 10 fL (ref 7.5–12.5)
Monocytes Relative: 5 %
NEUTROS PCT: 59 %
Neutro Abs: 3127 cells/uL (ref 1500–7800)
Platelets: 258 10*3/uL (ref 140–400)
RBC: 4.66 MIL/uL (ref 4.20–5.80)
RDW: 15.2 % — AB (ref 11.0–15.0)
WBC: 5.3 10*3/uL (ref 3.8–10.8)

## 2016-10-11 MED ORDER — TAMSULOSIN HCL 0.4 MG PO CAPS: 0.4000 mg | ORAL_CAPSULE | Freq: Every day | ORAL | 3 refills | Status: DC

## 2016-10-11 NOTE — Progress Notes (Signed)
Name: Melvin Williams   MRN: [id number]    DOB: 01-22-1961   Date:10/11/2016       Progress Note  Subjective  Chief Complaint  Chief Complaint  Patient presents with  . Annual Exam    HPI   Well male exam: not sexually active for the past 15 years, he has nocturia since age 56, and states Flomax seems to help. He has lost a lot of weight since 2017 because of diagnosis of pre-diabetes. Eats a high protein, low carbohydrate diet, his bp has been at goal and he has been off bp medication for the past 6 weeks. .  Hyperglycemia: he has gained a lot of weight from 2015 till Summer of 2017 , he was up to 229 lbs07/2017. He has changed his diet and is down to 167.2 lbs, with BMI almost perfect for height. We will recheck labs. Doing great  HTN:off medications since significant weight loss, used to take lisinopril and had proteinuria at one point  BPH: he states medications is helping with symptoms.   IPSS Questionnaire (AUA-7): Over the past month.   1)  How often have you had a sensation of not emptying your bladder completely after you finish urinating?  1 - Less than 1 time in 5  2)  How often have you had to urinate again less than two hours after you finished urinating? 3 - About half the time  3)  How often have you found you stopped and started again several times when you urinated?  3 - About half the time  4) How difficult have you found it to postpone urination?  1 - Less than 1 time in 5  5) How often have you had a weak urinary stream?  4 - More than half the time  6) How often have you had to push or strain to begin urination?  0 - Not at all  7) How many times did you most typically get up to urinate from the time you went to bed until the time you got up in the morning?  3 - 3 times  Total score:  0-7 mildly symptomatic   8-19 moderately symptomatic   20-35 severely symptomatic   Score of 15   Patient Active Problem List   Diagnosis Date Noted  . Benign prostatic  hyperplasia without lower urinary tract symptoms 04/11/2016  . PSA elevation 04/11/2016  . Pre-diabetes 04/11/2016  . Hypertension, benign 10/10/2015  . Hyperlipidemia 10/10/2015  . Hyperglycemia 10/10/2015  . Vitamin D deficiency 10/10/2015  . Microalbuminuria 10/10/2015  . Overweight (BMI 25.0-29.9) 10/10/2015  . Eczema 10/10/2015  . Seborrhea 10/10/2015  . Fatty liver 10/10/2015  . Cholelithiasis 10/10/2015  . Cervical disc disease 10/10/2015    History reviewed. No pertinent surgical history.  Family History  Problem Relation Age of Onset  . Cancer Mother   . Heart disease Father     Social History   Social History  . Marital status: Single    Spouse name: N/A  . Number of children: N/A  . Years of education: N/A   Occupational History  . Not on file.   Social History Main Topics  . Smoking status: Never Smoker  . Smokeless tobacco: Never Used  . Alcohol use No  . Drug use: No  . Sexual activity: No   Other Topics Concern  . Not on file   Social History Narrative  . No narrative on file     Current Outpatient Prescriptions:  .  aspirin EC 81 MG tablet, Take by mouth., Disp: , Rfl:  .  cholecalciferol (VITAMIN D) 1000 units tablet, Take 1,000 Units by mouth daily., Disp: , Rfl:  .  ibuprofen (ADVIL,MOTRIN) 200 MG tablet, Take 200 mg by mouth as needed., Disp: , Rfl:  .  tamsulosin (FLOMAX) 0.4 MG CAPS capsule, Take 1 capsule (0.4 mg total) by mouth daily., Disp: 90 capsule, Rfl: 3  No Known Allergies   ROS  Constitutional: Negative for fever, positive for  weight change.  Respiratory: Negative for cough and shortness of breath.   Cardiovascular: Negative for chest pain or palpitations.  Gastrointestinal: Negative for abdominal pain, no bowel changes.  Musculoskeletal: Negative for gait problem or joint swelling.  Skin: Negative for rash.  Neurological: Negative for dizziness or headache.  No other specific complaints in a complete review of  systems (except as listed in HPI above).  Objective  Vitals:   10/11/16 0825  BP: 122/68  Pulse: 65  Resp: 18  Temp: 98.4 F (36.9 C)  SpO2: 98%  Weight: 167 lb 2 oz (75.8 kg)  Height: 5' 7.3" (1.709 m)    Body mass index is 25.94 kg/m.  Physical Exam  Constitutional: Patient appears well-developed and well-nourished. No distress.  HENT: Head: Normocephalic and atraumatic. Ears: B TMs ok, no erythema or effusion; Nose: Nose normal. Mouth/Throat: Oropharynx is clear and moist. No oropharyngeal exudate.  Eyes: Conjunctivae and EOM are normal. Pupils are equal, round, and reactive to light. No scleral icterus.  Neck: Normal range of motion. Neck supple. No JVD present. No thyromegaly present.  Cardiovascular: Normal rate, regular rhythm and normal heart sounds.  No murmur heard. No BLE edema. Pulmonary/Chest: Effort normal and breath sounds normal. No respiratory distress. Abdominal: Soft. Bowel sounds are normal, no distension. There is no tenderness. no masses MALE GENITALIA: Normal descended testes bilaterally, no masses palpated, no hernias, no lesions, no discharge RECTAL: Prostate normal size ( did not seem enlarged today)  and consistency, no rectal masses or hemorrhoids  Musculoskeletal: Normal range of motion, no joint effusions. No gross deformities Neurological: he is alert and oriented to person, place, and time. No cranial nerve deficit. Coordination, balance, strength, speech and gait are normal.  Skin: Skin is warm and dry. No rash noted. No erythema.  Psychiatric: Patient has a normal mood and affect. behavior is normal. Judgment and thought content normal.  PHQ2/9: Depression screen [facility] 2/9 10/11/2016 04/11/2016 10/10/2015  Decreased Interest 0 0 0  Down, Depressed, Hopeless 0 0 0  PHQ - 2 Score 0 0 0  Altered sleeping - - 3  Tired, decreased energy - - 1  Change in appetite - - 3  Feeling bad or failure about yourself  - - 0  Trouble concentrating - - 0  Moving  slowly or fidgety/restless - - 0  Suicidal thoughts - - 0  PHQ-9 Score - - 7    Fall Risk: Fall Risk  10/11/2016 04/11/2016  Falls in the past year? No No    Functional Status Survey: Is the patient deaf or have difficulty hearing?: No Does the patient have difficulty seeing, even when wearing glasses/contacts?: Yes (glasses) Does the patient have difficulty concentrating, remembering, or making decisions?: No Does the patient have difficulty walking or climbing stairs?: No Does the patient have difficulty dressing or bathing?: No Does the patient have difficulty doing errands alone such as visiting a doctor's office or shopping?: No   Assessment & Plan  1. Well adult exam  Discussed importance of 150 minutes of physical activity weekly, eat two servings of fish weekly, eat one serving of tree nuts ( cashews, pistachios, pecans, almonds.Marland Kitchen) every other day, eat 6 servings of fruit/vegetables daily and drink plenty of water and avoid sweet beverages.  -EKG  2. Benign prostatic hyperplasia with lower urinary tract symptoms, symptom details unspecified  - tamsulosin (FLOMAX) 0.4 MG CAPS capsule; Take 1 capsule (0.4 mg total) by mouth daily.  Dispense: 90 capsule; Refill: 3 - PSA  - Moderate symptoms, discussed adding or changing to Avodart, or referral to Urologist, but we will monitor for the next year and see if symptoms are stable or getting worse   3. Vitamin D deficiency  - VITAMIN D 25 Hydroxy (Vit-D Deficiency, Fractures)  4. PSA elevation  - PSA  5. Pre-diabetes  - Hemoglobin A1c - Insulin, fasting  6. Hypertension, benign  - COMPLETE METABOLIC PANEL WITH GFR - CBC with Differential/Platelet - urine micro  - EKG  7. Lipid screening  - Lipid panel

## 2016-10-11 NOTE — Patient Instructions (Signed)

## 2016-10-12 LAB — LIPID PANEL
Cholesterol: 176 mg/dL (ref <200)
HDL: 58 mg/dL (ref >40)
LDL CALC: 107 mg/dL — AB (ref <100)
Total CHOL/HDL Ratio: 3 Ratio (ref <5.0)
Triglycerides: 53 mg/dL (ref <150)
VLDL: 11 mg/dL (ref <30)

## 2016-10-12 LAB — COMPLETE METABOLIC PANEL WITH GFR
ALT: 30 U/L (ref 9–46)
AST: 21 U/L (ref 10–35)
Albumin: 4.6 g/dL (ref 3.6–5.1)
Alkaline Phosphatase: 64 U/L (ref 40–115)
BUN: 22 mg/dL (ref 7–25)
CALCIUM: 9.4 mg/dL (ref 8.6–10.3)
CO2: 27 mmol/L (ref 20–31)
CREATININE: 0.83 mg/dL (ref 0.70–1.33)
Chloride: 105 mmol/L (ref 98–110)
GFR, Est African American: >89 mL/min (ref >=60)
GFR, Est Non African American: >89 mL/min (ref >=60)
GLUCOSE: 89 mg/dL (ref 65–99)
POTASSIUM: 4.8 mmol/L (ref 3.5–5.3)
SODIUM: 140 mmol/L (ref 135–146)
Total Bilirubin: 0.8 mg/dL (ref 0.2–1.2)
Total Protein: 7.1 g/dL (ref 6.1–8.1)

## 2016-10-12 LAB — MICROALBUMIN / CREATININE URINE RATIO
Creatinine, Urine: 71 mg/dL (ref 20–370)
Microalb Creat Ratio: 4 mcg/mg creat (ref <30)
Microalb, Ur: 0.3 mg/dL

## 2016-10-12 LAB — PSA: PSA: 2.4 ng/mL (ref <=4.0)

## 2016-10-12 LAB — HEMOGLOBIN A1C
Hgb A1c MFr Bld: 5.3 % (ref <5.7)
Mean Plasma Glucose: 105 mg/dL

## 2016-10-12 LAB — INSULIN, FASTING: INSULIN FASTING, SERUM: 3.4 u[IU]/mL (ref 2.0–19.6)

## 2016-10-13 LAB — VITAMIN D 25 HYDROXY (VIT D DEFICIENCY, FRACTURES): VIT D 25 HYDROXY: 38 ng/mL (ref 30–100)

## 2016-11-14 DIAGNOSIS — H524 Presbyopia: Secondary | ICD-10-CM | POA: Diagnosis not present

## 2017-10-17 ENCOUNTER — Ambulatory Visit (INDEPENDENT_AMBULATORY_CARE_PROVIDER_SITE_OTHER): Payer: BLUE CROSS/BLUE SHIELD | Admitting: Family Medicine

## 2017-10-17 ENCOUNTER — Encounter: Payer: Self-pay | Admitting: Family Medicine

## 2017-10-17 VITALS — BP 152/94 | HR 77 | Temp 98.4°F | Resp 16 | Ht 67.0 in | Wt 194.5 lb

## 2017-10-17 DIAGNOSIS — E559 Vitamin D deficiency, unspecified: Secondary | ICD-10-CM

## 2017-10-17 DIAGNOSIS — I10 Essential (primary) hypertension: Secondary | ICD-10-CM | POA: Diagnosis not present

## 2017-10-17 DIAGNOSIS — N401 Enlarged prostate with lower urinary tract symptoms: Secondary | ICD-10-CM

## 2017-10-17 DIAGNOSIS — Z0000 Encounter for general adult medical examination without abnormal findings: Secondary | ICD-10-CM

## 2017-10-17 DIAGNOSIS — R809 Proteinuria, unspecified: Secondary | ICD-10-CM

## 2017-10-17 DIAGNOSIS — R7303 Prediabetes: Secondary | ICD-10-CM | POA: Diagnosis not present

## 2017-10-17 DIAGNOSIS — Z13220 Encounter for screening for lipoid disorders: Secondary | ICD-10-CM

## 2017-10-17 DIAGNOSIS — E782 Mixed hyperlipidemia: Secondary | ICD-10-CM

## 2017-10-17 MED ORDER — TAMSULOSIN HCL 0.4 MG PO CAPS: 0.4000 mg | ORAL_CAPSULE | Freq: Every day | ORAL | 3 refills | Status: DC

## 2017-10-17 MED ORDER — PRAVASTATIN SODIUM 40 MG PO TABS: 40.0000 mg | ORAL_TABLET | Freq: Every day | ORAL | 3 refills | Status: DC

## 2017-10-17 MED ORDER — LISINOPRIL 20 MG PO TABS: 20.0000 mg | ORAL_TABLET | Freq: Every day | ORAL | 0 refills | Status: DC

## 2017-10-17 NOTE — Progress Notes (Signed)
Name: Melvin Williams   MRN: [id number]    DOB: December 02, 1960   Date:10/17/2017       Progress Note  Subjective  Chief Complaint  Chief Complaint  Patient presents with  . Annual Exam    HPI  Patient presents for annual CPE and follow up .  HTN: he had lost weight on his last visit and was off lisinopril and bp was at goal, however he is not as compliant with his diet and today bp is high, we will avoid HCTZ because BPH, we will try higher dose lisinopril and recheck bp with CMA in one month  BPH: he feels like medication has helped with symptoms and wants to continue flomax, we will recheck PSA  Hyperlipidemia: resume medication since he has gained weight and recheck labs  Obesity: he has obesity and co-morbidities, discussed cutting down on cheat days from 3 times a week to once a week and see if he will lose weight again.   USPSTF grade A and B recommendations:  Diet: he has gained a lot of weight since last visit, he is splurging 3 times a week - when he is off  Exercise: only active at work  Depression:  Depression screen [facility] 2/9 10/17/2017 10/11/2016 04/11/2016 10/10/2015  Decreased Interest 0 0 0 0  Down, Depressed, Hopeless 0 0 0 0  PHQ - 2 Score 0 0 0 0  Altered sleeping 0 - - 3  Tired, decreased energy 1 - - 1  Change in appetite 1 - - 3  Feeling bad or failure about yourself  0 - - 0  Trouble concentrating 0 - - 0  Moving slowly or fidgety/restless 0 - - 0  Suicidal thoughts 0 - - 0  PHQ-9 Score 2 - - 7  Difficult doing work/chores Not difficult at all - - -    Hypertension:  BP Readings from Last 3 Encounters:  10/17/17 (!) 152/94  10/11/16 122/68  04/11/16 118/76    Obesity: Wt Readings from Last 3 Encounters:  10/17/17 194 lb 8 oz (88.2 kg)  10/11/16 167 lb 2 oz (75.8 kg)  04/11/16 184 lb 14.4 oz (83.9 kg)   BMI Readings from Last 3 Encounters:  10/17/17 30.46 kg/m  10/11/16 25.94 kg/m  04/11/16 28.11 kg/m     Lipids:  Lab Results  Component  Value Date   CHOL 176 10/11/2016   CHOL 188 04/11/2016   CHOL 207 (H) 10/10/2015   Lab Results  Component Value Date   HDL 58 10/11/2016   HDL 56 04/11/2016   HDL 61 10/10/2015   Lab Results  Component Value Date   LDLCALC 107 (H) 10/11/2016   LDLCALC 119 (H) 04/11/2016   LDLCALC 107 10/10/2015   Lab Results  Component Value Date   TRIG 53 10/11/2016   TRIG 65 04/11/2016   TRIG 194 (H) 10/10/2015   Lab Results  Component Value Date   CHOLHDL 3.0 10/11/2016   CHOLHDL 3.4 04/11/2016   CHOLHDL 3.4 10/10/2015   No results found for: LDLDIRECT Glucose:  Glucose, Bld  Date Value Ref Range Status  10/11/2016 89 65 - 99 mg/dL Final  04/11/2016 92 65 - 99 mg/dL Final  10/10/2015 82 65 - 99 mg/dL Final      [facility] 10/17/2017 in [facility]  AUDIT-C Score  0      Single STD testing and prevention (HIV/chl/gon/syphilis): N/A - not in 16 years  Hep C: negative   Skin  cancer:  Colorectal cancer: up to date Prostate cancer: history of BPH we will check PSA  Lab Results  Component Value Date   PSA 2.4 10/11/2016   PSA 2.4 04/11/2016   PSA 2.7 01/04/2016    IPSS Questionnaire (AUA-7): Over the past month.   1)  How often have you had a sensation of not emptying your bladder completely after you finish urinating?  1 - Less than 1 time in 5  2)  How often have you had to urinate again less than two hours after you finished urinating? 3 - About half the time  3)  How often have you found you stopped and started again several times when you urinated?  0 - Not at all  4) How difficult have you found it to postpone urination?  2 - Less than half the time  5) How often have you had a weak urinary stream?  1 - Less than 1 time in 5  6) How often have you had to push or strain to begin urination?  0 - Not at all  7) How many times did you most typically get up to urinate from the time you went to bed until the time you got up in the morning?  2 - 2  times  Total score:  0-7 mildly symptomatic   8-19 moderately symptomatic   20-35 severely symptomatic    ECG:  Done 10/2016   Advanced Care Planning: A voluntary discussion about advance care planning including the explanation and discussion of advance directives.  Discussed health care proxy and Living will, and the patient was able to identify a health care proxy as sister - Skip Mayer.  Patient does not have a living will at present time.   Patient Active Problem List   Diagnosis Date Noted  . Morbid obesity, unspecified obesity type ([place]) 10/17/2017  . Benign prostatic hyperplasia without lower urinary tract symptoms 04/11/2016  . PSA elevation 04/11/2016  . Pre-diabetes 04/11/2016  . Hypertension, benign 10/10/2015  . Hyperlipidemia 10/10/2015  . Hyperglycemia 10/10/2015  . Vitamin D deficiency 10/10/2015  . Microalbuminuria 10/10/2015  . Overweight (BMI 25.0-29.9) 10/10/2015  . Eczema 10/10/2015  . Seborrhea 10/10/2015  . Fatty liver 10/10/2015  . Cholelithiasis 10/10/2015  . Cervical disc disease 10/10/2015    History reviewed. No pertinent surgical history.  Family History  Problem Relation Age of Onset  . Breast cancer Mother   . Liver cancer Mother   . Heart disease Father   . Heart attack Father   . Heart attack Paternal Grandfather     Social History   Socioeconomic History  . Marital status: Single    Spouse name: Not on file  . Number of children: 0  . Years of education: Not on file  . Highest education level: High school graduate  Occupational History  . Not on file  Social Needs  . Financial resource strain: Not on file  . Food insecurity:    Worry: Not on file    Inability: Not on file  . Transportation needs:    Medical: No    Non-medical: No  Tobacco Use  . Smoking status: Never Smoker  . Smokeless tobacco: Never Used  Substance and Sexual Activity  . Alcohol use: No  . Drug use: No  . Sexual activity: Never  Lifestyle  .  Physical activity:    Days per week: 0 days    Minutes per session: Not on file  . Stress: Not at  all  Relationships  . Social connections:    Talks on phone: Never    Gets together: Never    Attends religious service: Never    Active member of club or organization: No    Attends meetings of clubs or organizations: Never    Relationship status: Never married  . Intimate partner violence:    Fear of current or ex partner: No    Emotionally abused: No    Physically abused: No    Forced sexual activity: No  Other Topics Concern  . Not on file  Social History Narrative  . Not on file     Current Outpatient Medications:  .  aspirin EC 81 MG tablet, Take by mouth., Disp: , Rfl:  .  cholecalciferol (VITAMIN D) 1000 units tablet, Take 1,000 Units by mouth daily., Disp: , Rfl:  .  ibuprofen (ADVIL,MOTRIN) 200 MG tablet, Take 200 mg by mouth as needed., Disp: , Rfl:  .  tamsulosin (FLOMAX) 0.4 MG CAPS capsule, Take 1 capsule (0.4 mg total) by mouth daily., Disp: 90 capsule, Rfl: 3 .  lisinopril (PRINIVIL,ZESTRIL) 20 MG tablet, Take 1 tablet (20 mg total) by mouth daily., Disp: 30 tablet, Rfl: 0 .  pravastatin (PRAVACHOL) 40 MG tablet, Take 1 tablet (40 mg total) by mouth daily., Disp: 90 tablet, Rfl: 3  No Known Allergies   ROS  Constitutional: Negative for fever , positive for weight change.  Respiratory: Negative for cough and shortness of breath.   Cardiovascular: Negative for chest pain or palpitations.  Gastrointestinal: Negative for abdominal pain, no bowel changes.  Musculoskeletal: Negative for gait problem or joint swelling.  Skin: Negative for rash.  Neurological: Negative for dizziness or headache.  No other specific complaints in a complete review of systems (except as listed in HPI above).   Objective  Vitals:   10/17/17 0836 10/17/17 0845  BP: (!) 158/96 (!) 152/94  Pulse: 77   Resp: 16   Temp: 98.4 F (36.9 C)   TempSrc: Oral   SpO2: 97%   Weight: 194 lb  8 oz (88.2 kg)   Height: 5\' 7"  (1.702 m)     Body mass index is 30.46 kg/m.  Physical Exam  Constitutional: Patient appears well-developed and well-nourished. No distress.  HENT: Head: Normocephalic and atraumatic. Ears: B TMs ok, no erythema or effusion; Nose: Nose normal. Mouth/Throat: Oropharynx is clear and moist. No oropharyngeal exudate.  Eyes: Conjunctivae and EOM are normal. Pupils are equal, round, and reactive to light. No scleral icterus.  Neck: Normal range of motion. Neck supple. No JVD present. No thyromegaly present.  Cardiovascular: Normal rate, regular rhythm and normal heart sounds.  No murmur heard. No BLE edema. Pulmonary/Chest: Effort normal and breath sounds normal. No respiratory distress. Abdominal: Soft. Bowel sounds are normal, no distension. There is no tenderness. no masses MALE GENITALIA: Normal descended testes bilaterally, no masses palpated, no hernias, no lesions, no discharge RECTAL: Prostate normal size and consistency, no rectal masses or hemorrhoids Musculoskeletal: Normal range of motion, no joint effusions. No gross deformities Neurological: he is alert and oriented to person, place, and time. No cranial nerve deficit. Coordination, balance, strength, speech and gait are normal.  Skin: Skin is warm and dry. No rash noted. No erythema.  Psychiatric: Patient has a normal mood and affect. behavior is normal. Judgment and thought content normal.   PHQ2/9: Depression screen [facility] 2/9 10/17/2017 10/11/2016 04/11/2016 10/10/2015  Decreased Interest 0 0 0 0  Down, Depressed, Hopeless 0 0  0 0  PHQ - 2 Score 0 0 0 0  Altered sleeping 0 - - 3  Tired, decreased energy 1 - - 1  Change in appetite 1 - - 3  Feeling bad or failure about yourself  0 - - 0  Trouble concentrating 0 - - 0  Moving slowly or fidgety/restless 0 - - 0  Suicidal thoughts 0 - - 0  PHQ-9 Score 2 - - 7  Difficult doing work/chores Not difficult at all - - -     Fall Risk: Fall Risk   10/17/2017 10/17/2017 10/11/2016 04/11/2016  Falls in the past year? No No No No    Functional Status Survey: Is the patient deaf or have difficulty hearing?: No Does the patient have difficulty seeing, even when wearing glasses/contacts?: Yes Does the patient have difficulty concentrating, remembering, or making decisions?: No Does the patient have difficulty walking or climbing stairs?: No Does the patient have difficulty dressing or bathing?: No Does the patient have difficulty doing errands alone such as visiting a doctor's office or shopping?: No   Assessment & Plan  1. Well adult exam  Discussed importance of 150 minutes of physical activity weekly, eat two servings of fish weekly, eat one serving of tree nuts ( cashews, pistachios, pecans, almonds.Marland Kitchen) every other day, eat 6 servings of fruit/vegetables daily and drink plenty of water and avoid sweet beverages.   2. Morbid obesity, unspecified obesity type [facility])  Discussed with the patient the risk posed by an increased BMI. Discussed importance of portion control, calorie counting and at least 150 minutes of physical activity weekly. Avoid sweet beverages and drink more water. Eat at least 6 servings of fruit and vegetables daily   3. Vitamin D deficiency  - VITAMIN D 25 Hydroxy (Vit-D Deficiency, Fractures)  4. Benign prostatic hyperplasia with lower urinary tract symptoms, symptom details unspecified  - PSA - tamsulosin (FLOMAX) 0.4 MG CAPS capsule; Take 1 capsule (0.4 mg total) by mouth daily.  Dispense: 90 capsule; Refill: 3  5. Pre-diabetes  - Hemoglobin A1c  6. Microalbuminuria  - Urine Microalbumin w/creat. ratio  7. Mixed hyperlipidemia  - pravastatin (PRAVACHOL) 40 MG tablet; Take 1 tablet (40 mg total) by mouth daily.  Dispense: 90 tablet; Refill: 3  8. Lipid screening  - Lipid panel  9. Hypertension, benign  - CBC with Differential/Platelet - COMPLETE METABOLIC PANEL WITH GFR - lisinopril  (PRINIVIL,ZESTRIL) 20 MG tablet; Take 1 tablet (20 mg total) by mouth daily.  Dispense: 30 tablet; Refill: 0 - Urine Microalbumin w/creat. ratio

## 2017-10-17 NOTE — Patient Instructions (Signed)

## 2017-10-18 LAB — CBC WITH DIFFERENTIAL/PLATELET
Basophils Absolute: 51 {cells}/uL (ref 0–200)
Basophils Relative: 0.8 %
Eosinophils Absolute: 58 {cells}/uL (ref 15–500)
Eosinophils Relative: 0.9 %
HCT: 41.4 % (ref 38.5–50.0)
Hemoglobin: 13.9 g/dL (ref 13.2–17.1)
Lymphs Abs: 1958 {cells}/uL (ref 850–3900)
MCH: 28.8 pg (ref 27.0–33.0)
MCHC: 33.6 g/dL (ref 32.0–36.0)
MCV: 85.7 fL (ref 80.0–100.0)
MPV: 10.4 fL (ref 7.5–12.5)
Monocytes Relative: 6.6 %
Neutro Abs: 3910 {cells}/uL (ref 1500–7800)
Neutrophils Relative %: 61.1 %
Platelets: 290 Thousand/uL (ref 140–400)
RBC: 4.83 Million/uL (ref 4.20–5.80)
RDW: 14.1 % (ref 11.0–15.0)
Total Lymphocyte: 30.6 %
WBC mixed population: 422 {cells}/uL (ref 200–950)
WBC: 6.4 Thousand/uL (ref 3.8–10.8)

## 2017-10-18 LAB — COMPLETE METABOLIC PANEL WITHOUT GFR
AG Ratio: 1.8 (calc) (ref 1.0–2.5)
ALT: 22 U/L (ref 9–46)
AST: 21 U/L (ref 10–35)
Albumin: 4.6 g/dL (ref 3.6–5.1)
Alkaline phosphatase (APISO): 70 U/L (ref 40–115)
BUN: 20 mg/dL (ref 7–25)
CO2: 26 mmol/L (ref 20–32)
Calcium: 9.2 mg/dL (ref 8.6–10.3)
Chloride: 107 mmol/L (ref 98–110)
Creat: 0.72 mg/dL (ref 0.70–1.33)
GFR, Est African American: 119 mL/min/1.73m2 (ref >=60)
GFR, Est Non African American: 103 mL/min/1.73m2 (ref >=60)
Globulin: 2.6 g/dL (ref 1.9–3.7)
Glucose, Bld: 95 mg/dL (ref 65–99)
Potassium: 4.5 mmol/L (ref 3.5–5.3)
Sodium: 143 mmol/L (ref 135–146)
Total Bilirubin: 0.6 mg/dL (ref 0.2–1.2)
Total Protein: 7.2 g/dL (ref 6.1–8.1)

## 2017-10-18 LAB — LIPID PANEL
CHOLESTEROL: 184 mg/dL (ref <200)
HDL: 67 mg/dL (ref >40)
LDL Cholesterol (Calc): 103 mg/dL (calc) — ABNORMAL HIGH
Non-HDL Cholesterol (Calc): 117 mg/dL (calc) (ref <130)
TRIGLYCERIDES: 56 mg/dL (ref <150)
Total CHOL/HDL Ratio: 2.7 (calc) (ref <5.0)

## 2017-10-18 LAB — MICROALBUMIN / CREATININE URINE RATIO
Creatinine, Urine: 110 mg/dL (ref 20–320)
MICROALB/CREAT RATIO: 11 ug/mg{creat} (ref <30)
Microalb, Ur: 1.2 mg/dL

## 2017-10-18 LAB — HEMOGLOBIN A1C
Hgb A1c MFr Bld: 5.6 %{Hb} (ref <5.7)
Mean Plasma Glucose: 114 (calc)
eAG (mmol/L): 6.3 (calc)

## 2017-10-18 LAB — PSA: PSA: 3.2 ng/mL (ref <=4.0)

## 2017-10-18 LAB — VITAMIN D 25 HYDROXY (VIT D DEFICIENCY, FRACTURES): Vit D, 25-Hydroxy: 28 ng/mL — ABNORMAL LOW (ref 30–100)

## 2017-10-21 ENCOUNTER — Encounter: Payer: Self-pay | Admitting: Family Medicine

## 2017-10-31 ENCOUNTER — Other Ambulatory Visit: Payer: Self-pay | Admitting: Family Medicine

## 2017-10-31 DIAGNOSIS — N401 Enlarged prostate with lower urinary tract symptoms: Secondary | ICD-10-CM

## 2017-11-04 NOTE — Telephone Encounter (Signed)
I sent it last week, one year supply

## 2017-11-14 ENCOUNTER — Other Ambulatory Visit: Payer: Self-pay

## 2017-11-14 ENCOUNTER — Ambulatory Visit: Payer: BLUE CROSS/BLUE SHIELD

## 2017-11-14 VITALS — BP 118/70

## 2017-11-14 DIAGNOSIS — I10 Essential (primary) hypertension: Secondary | ICD-10-CM

## 2017-11-14 MED ORDER — LISINOPRIL 20 MG PO TABS: 20.0000 mg | ORAL_TABLET | Freq: Every day | ORAL | 1 refills | Status: DC

## 2017-11-14 NOTE — Progress Notes (Signed)
Continue current dose, bp is at goal today

## 2017-11-21 ENCOUNTER — Ambulatory Visit: Payer: BLUE CROSS/BLUE SHIELD

## 2017-12-04 ENCOUNTER — Other Ambulatory Visit: Payer: Self-pay | Admitting: Family Medicine

## 2017-12-04 ENCOUNTER — Encounter: Payer: Self-pay | Admitting: Family Medicine

## 2017-12-04 DIAGNOSIS — R972 Elevated prostate specific antigen [PSA]: Secondary | ICD-10-CM

## 2018-01-13 ENCOUNTER — Ambulatory Visit (INDEPENDENT_AMBULATORY_CARE_PROVIDER_SITE_OTHER): Payer: BLUE CROSS/BLUE SHIELD | Admitting: Urology

## 2018-01-13 ENCOUNTER — Encounter: Payer: Self-pay | Admitting: Urology

## 2018-01-13 VITALS — BP 98/66 | HR 103 | Ht 67.0 in | Wt 200.0 lb

## 2018-01-13 DIAGNOSIS — R972 Elevated prostate specific antigen [PSA]: Secondary | ICD-10-CM

## 2018-01-13 NOTE — Progress Notes (Addendum)
01/13/2018 6:05 PM   Melvin Williams 1960/06/22 [id number]  Referring provider: Steele Sizer, MD [address] [place], [place] [address]  CC: Rising PSA  HPI: I had the pleasure of seeing Melvin Williams in consultation today for rising PSA from Dr. Ancil Boozer.  He is a 57 year old relatively healthy man with a long history of urinary symptoms well controlled on Flomax who presents with a PSA of 3.2 from 2.4 previously.  He does not have a family history of prostate cancer.  He has a family history of lethal breast cancer in his mother.  He denies any gross hematuria or difficulty with erectile dysfunction.  He has never had a prostate biopsy previously.  There are no aggravating or alleviating factors.  Duration is a slow rise from 2017.   PMH: Past Medical History:  Diagnosis Date  . GERD (gastroesophageal reflux disease)   . Hyperlipidemia   . Hypertension     Surgical History: History reviewed. No pertinent surgical history.  Allergies: No Known Allergies  Family History: Family History  Problem Relation Age of Onset  . Breast cancer Mother   . Liver cancer Mother   . Heart disease Father   . Heart attack Father   . Heart attack Paternal Grandfather     Social History:  reports that he has never smoked. He has never used smokeless tobacco. He reports that he does not drink alcohol or use drugs.  ROS: Please see flowsheet from today's date for complete review of systems.  Physical Exam: BP 98/66   Pulse (!) 103   Ht 5\' 7"  (1.702 m)   Wt 200 lb (90.7 kg)   BMI 31.32 kg/m    Constitutional:  Alert and oriented, No acute distress. Cardiovascular: No clubbing, cyanosis, or edema. Respiratory: Normal respiratory effort, no increased work of breathing. GI: Abdomen is soft, nontender, nondistended, no abdominal masses GU: No CVA tenderness DRE: 30 g, smooth, no nodules Lymph: No cervical or inguinal lymphadenopathy. Skin: No rashes, bruises or suspicious  lesions. Neurologic: Grossly intact, no focal deficits, moving all 4 extremities. Psychiatric: Normal mood and affect.  Laboratory Data: PSA history 10/2017: 3.2 10/2016: 2.4 03/2016: 2.4 12/2015: 2.7 11/2015 2.9  Pertinent Imaging: None to review  Assessment & Plan:   In summary, Melvin Williams is a 57 year old male with no family history of prostate cancer and relatively stable PSA over the last 3 years, with peak of 3.2 in August 2019 from 2.4 last year.  His DRE is benign, and I do not feel he warrants biopsy at this time.  We reviewed the implications of an screening PSA and the uncertainty surrounding it. In general, a man's PSA increases with age and is produced by both normal and cancerous prostate tissue. The differential diagnosis for elevated PSA includes BPH, prostate cancer, infection, recent intercourse/ejaculation, recent urethroscopic manipulation (foley placement/cystoscopy) or trauma, and prostatitis.   Management of an elevated PSA can include observation or prostate biopsy and we discussed this in detail. Our goal is to detect clinically significant prostate cancers, and manage with either active surveillance, surgery, or radiation for localized disease. Risks of prostate biopsy include bleeding, infection (including life threatening sepsis), pain, and lower urinary symptoms. Hematuria, hematospermia, and blood in the stool are all common after biopsy and can persist up to 4 weeks.   Return in about 1 year (around 01/14/2019) for PSA/DRE.   ADDENDUM: Repeat PSA 01/13/18 4.0, 11% free.  Discussed options including immediate biopsy, or repeat PSA  in 2 to 3 weeks.  He elects to repeat PSA in 2 to 3 weeks.  If this remains 4 or greater I would recommend proceeding with prostate biopsy.  Patient in agreement  Billey Co, [place] [address] [place], [place] [address] [phone]

## 2018-01-14 LAB — PSA, TOTAL AND FREE
PSA FREE PCT: 11 %
PSA FREE: 0.44 ng/mL
Prostate Specific Ag, Serum: 4 ng/mL (ref 0.0–4.0)

## 2018-01-16 NOTE — Addendum Note (Signed)
Addended by: Billey Co on: 01/16/2018 05:23 PM   Modules accepted: Orders

## 2018-02-18 ENCOUNTER — Other Ambulatory Visit: Payer: BLUE CROSS/BLUE SHIELD

## 2018-02-18 DIAGNOSIS — R972 Elevated prostate specific antigen [PSA]: Secondary | ICD-10-CM | POA: Diagnosis not present

## 2018-02-19 ENCOUNTER — Telehealth: Payer: Self-pay

## 2018-02-19 LAB — PSA TOTAL (REFLEX TO FREE): PROSTATE SPECIFIC AG, SERUM: 3.4 ng/mL (ref 0.0–4.0)

## 2018-02-19 NOTE — Telephone Encounter (Signed)
-----   Message from Billey Co, MD sent at 02/19/2018  1:53 PM EST ----- PSA came down to normal range of 3.4 from 4, keep scheduled follow up in one year for repeat PSA. Would not recommend biopsy at this time.  Nickolas Madrid, MD 02/19/2018

## 2018-05-11 ENCOUNTER — Other Ambulatory Visit: Payer: Self-pay | Admitting: Family Medicine

## 2018-05-11 DIAGNOSIS — I10 Essential (primary) hypertension: Secondary | ICD-10-CM

## 2018-07-03 ENCOUNTER — Encounter: Payer: Self-pay | Admitting: Family Medicine

## 2018-10-28 ENCOUNTER — Other Ambulatory Visit: Payer: Self-pay | Admitting: Family Medicine

## 2018-10-28 DIAGNOSIS — N401 Enlarged prostate with lower urinary tract symptoms: Secondary | ICD-10-CM

## 2018-11-03 ENCOUNTER — Other Ambulatory Visit: Payer: Self-pay | Admitting: Family Medicine

## 2018-11-03 DIAGNOSIS — R809 Proteinuria, unspecified: Secondary | ICD-10-CM

## 2018-11-03 DIAGNOSIS — I10 Essential (primary) hypertension: Secondary | ICD-10-CM

## 2018-11-03 DIAGNOSIS — N401 Enlarged prostate with lower urinary tract symptoms: Secondary | ICD-10-CM

## 2018-11-03 DIAGNOSIS — E782 Mixed hyperlipidemia: Secondary | ICD-10-CM

## 2018-11-03 DIAGNOSIS — E559 Vitamin D deficiency, unspecified: Secondary | ICD-10-CM

## 2018-11-03 DIAGNOSIS — R7303 Prediabetes: Secondary | ICD-10-CM

## 2018-11-03 DIAGNOSIS — R972 Elevated prostate specific antigen [PSA]: Secondary | ICD-10-CM

## 2018-11-03 MED ORDER — LISINOPRIL 20 MG PO TABS: 20.0000 mg | ORAL_TABLET | Freq: Every day | ORAL | 0 refills | Status: DC

## 2018-11-04 NOTE — Telephone Encounter (Signed)
vm not set up

## 2018-12-05 ENCOUNTER — Telehealth: Payer: Self-pay | Admitting: Family Medicine

## 2018-12-05 DIAGNOSIS — I10 Essential (primary) hypertension: Secondary | ICD-10-CM

## 2018-12-05 NOTE — Telephone Encounter (Signed)
Requested medication (s) are due for refill today: yes  Requested medication (s) are on the active medication list:yes  Last refill:  11/03/2018  Future visit scheduled: no  Notes to clinic:  Review for refill   Requested Prescriptions  Pending Prescriptions Disp Refills   lisinopril (ZESTRIL) 20 MG tablet [Pharmacy Med Name: LISINOPRIL 20MG  TABLETS] 30 tablet 0    Sig: TAKE 1 TABLET(20 MG) BY MOUTH DAILY     Cardiovascular:  ACE Inhibitors Failed - 12/05/2018 12:25 PM      Failed - Cr in normal range and within 180 days    Creat  Date Value Ref Range Status  10/17/2017 0.72 0.70 - 1.33 mg/dL Final    Comment:    For patients >78 years of age, the reference limit for Creatinine is approximately 13% higher for people identified as African-American. .          Failed - K in normal range and within 180 days    Potassium  Date Value Ref Range Status  10/17/2017 4.5 3.5 - 5.3 mmol/L Final         Failed - Valid encounter within last 6 months    Recent Outpatient Visits          1 year ago Well adult exam   [facility] Steele Sizer, MD   2 years ago Well adult exam   [facility] Steele Sizer, MD   2 years ago Hypertension, benign   [facility] Steele Sizer, MD   2 years ago PSA elevation   [facility] Steele Sizer, MD   3 years ago Encounter for routine history and physical exam for male   [facility] Steele Sizer, MD      Future Appointments            In 1 month [facility] Herbert Seta, MD [place] - Patient is not pregnant      Passed - Last BP in normal range    BP Readings from Last 1 Encounters:  01/13/18 98/66

## 2018-12-08 ENCOUNTER — Other Ambulatory Visit: Payer: Self-pay | Admitting: Family Medicine

## 2018-12-08 DIAGNOSIS — I10 Essential (primary) hypertension: Secondary | ICD-10-CM

## 2018-12-08 NOTE — Telephone Encounter (Signed)
Requested medication (s) are due for refill today:yes  Requested medication (s) are on the active medication list:yes  Last refill: 12/06/2018  Future visit scheduled: No  Notes to clinic: Needs appointment last fill for 1 week.    Requested Prescriptions  Pending Prescriptions Disp Refills   lisinopril (ZESTRIL) 20 MG tablet [Pharmacy Med Name: LISINOPRIL 20MG  TABLETS] 30 tablet     Sig: TAKE 1 TABLET(20 MG) BY MOUTH DAILY     Cardiovascular:  ACE Inhibitors Failed - 12/08/2018  2:01 PM      Failed - Cr in normal range and within 180 days    Creat  Date Value Ref Range Status  10/17/2017 0.72 0.70 - 1.33 mg/dL Final    Comment:    For patients >38 years of age, the reference limit for Creatinine is approximately 13% higher for people identified as African-American. .          Failed - K in normal range and within 180 days    Potassium  Date Value Ref Range Status  10/17/2017 4.5 3.5 - 5.3 mmol/L Final         Failed - Valid encounter within last 6 months    Recent Outpatient Visits          1 year ago Well adult exam   [facility] Steele Sizer, MD   2 years ago Well adult exam   [facility] Steele Sizer, MD   2 years ago Hypertension, benign   [facility] Steele Sizer, MD   3 years ago PSA elevation   [facility] Steele Sizer, MD   3 years ago Encounter for routine history and physical exam for male   [facility] Steele Sizer, MD      Future Appointments            In 1 week Steele Sizer, MD [facility] [place]   In 1 month Billey Co, MD [place] - Patient is not pregnant      Passed - Last BP in normal range    BP Readings from Last 1 Encounters:  01/13/18 98/66

## 2018-12-08 NOTE — Telephone Encounter (Signed)
Called for scheduling. VM not setup

## 2018-12-15 ENCOUNTER — Other Ambulatory Visit: Payer: Self-pay

## 2018-12-15 ENCOUNTER — Ambulatory Visit: Payer: BLUE CROSS/BLUE SHIELD | Admitting: Family Medicine

## 2018-12-15 ENCOUNTER — Encounter: Payer: Self-pay | Admitting: Family Medicine

## 2018-12-15 DIAGNOSIS — I10 Essential (primary) hypertension: Secondary | ICD-10-CM

## 2018-12-15 DIAGNOSIS — E782 Mixed hyperlipidemia: Secondary | ICD-10-CM

## 2018-12-15 DIAGNOSIS — N401 Enlarged prostate with lower urinary tract symptoms: Secondary | ICD-10-CM | POA: Diagnosis not present

## 2018-12-15 DIAGNOSIS — Z23 Encounter for immunization: Secondary | ICD-10-CM

## 2018-12-15 DIAGNOSIS — E559 Vitamin D deficiency, unspecified: Secondary | ICD-10-CM | POA: Diagnosis not present

## 2018-12-15 DIAGNOSIS — R7303 Prediabetes: Secondary | ICD-10-CM | POA: Diagnosis not present

## 2018-12-15 MED ORDER — LISINOPRIL 10 MG PO TABS: 10.0000 mg | ORAL_TABLET | Freq: Every day | ORAL | 1 refills | Status: DC

## 2018-12-15 NOTE — Progress Notes (Signed)
Name: Melvin Williams   MRN: [id number]    DOB: 1960/10/26   Date:12/15/2018       Progress Note  Subjective  Chief Complaint  Chief Complaint  Patient presents with  . Medication Refill  . Hypertension  . Dyslipidemia  . Elevated PSA  . Prediabetes    HPI  HTN: he had lost weight on his last visit and was off lisinopril and bp was at goal, however he is not as compliant with his diet , his weight is up over 20 lbs since last visit,  we will avoid HCTZ because BPH, he has been out of medication for the past month and bp is well controlled, he has gained weight, we will resume lisinopril 10 mg daily   BPH: he feels like medication has helped with symptoms and wants to continue flomax, we will recheck PSA . Also advised yearly CPE. He has follow up with Urologist Nov 2020   Hyperlipidemia: he has not been taking medications, we will recheck labs today and decide if he needs to resume medications  Obesity: he has gained 28 lbs in the past year, he is not following a healthy diet lately, no physically active, he has a history of pre-diabetes that was controlled with dietary modification. No polyphagia, polydipsia or polyuria.    Patient Active Problem List   Diagnosis Date Noted  . Morbid obesity, unspecified obesity type ([place]) 10/17/2017  . Benign prostatic hyperplasia without lower urinary tract symptoms 04/11/2016  . PSA elevation 04/11/2016  . Pre-diabetes 04/11/2016  . Hypertension, benign 10/10/2015  . Hyperlipidemia 10/10/2015  . Hyperglycemia 10/10/2015  . Vitamin D deficiency 10/10/2015  . Microalbuminuria 10/10/2015  . Overweight (BMI 25.0-29.9) 10/10/2015  . Eczema 10/10/2015  . Seborrhea 10/10/2015  . Fatty liver 10/10/2015  . Cholelithiasis 10/10/2015  . Cervical disc disease 10/10/2015    History reviewed. No pertinent surgical history.  Family History  Problem Relation Age of Onset  . Breast cancer Mother   . Liver cancer Mother   . Heart disease Father    . Heart attack Father   . Heart attack Paternal Grandfather     Social History   Socioeconomic History  . Marital status: Single    Spouse name: Not on file  . Number of children: 0  . Years of education: Not on file  . Highest education level: High school graduate  Occupational History  . Occupation: Cabin crew   Social Needs  . Financial resource strain: Not hard at all  . Food insecurity    Worry: Never true    Inability: Never true  . Transportation needs    Medical: No    Non-medical: No  Tobacco Use  . Smoking status: Never Smoker  . Smokeless tobacco: Never Used  Substance and Sexual Activity  . Alcohol use: No  . Drug use: No  . Sexual activity: Never  Lifestyle  . Physical activity    Days per week: 0 days    Minutes per session: 0 min  . Stress: Not at all  Relationships  . Social connections    Talks on phone: More than three times a week    Gets together: More than three times a week    Attends religious service: Never    Active member of club or organization: No    Attends meetings of clubs or organizations: Never    Relationship status: Never married  . Intimate partner violence    Fear of current or ex  partner: No    Emotionally abused: No    Physically abused: No    Forced sexual activity: No  Other Topics Concern  . Not on file  Social History Narrative   Promoted to transportation supervisor      Current Outpatient Medications:  .  aspirin EC 81 MG tablet, Take by mouth., Disp: , Rfl:  .  cholecalciferol (VITAMIN D) 1000 units tablet, Take 1,000 Units by mouth daily., Disp: , Rfl:  .  lisinopril (ZESTRIL) 10 MG tablet, Take 1 tablet (10 mg total) by mouth daily., Disp: 90 tablet, Rfl: 1 .  tamsulosin (FLOMAX) 0.4 MG CAPS capsule, TAKE 1 CAPSULE(0.4 MG) BY MOUTH DAILY, Disp: 90 capsule, Rfl: 0 .  ibuprofen (ADVIL,MOTRIN) 200 MG tablet, Take 200 mg by mouth as needed., Disp: , Rfl:   No Known Allergies  I personally  reviewed active problem list, medication list, allergies, family history, social history, health maintenance with the patient/caregiver today.   ROS  Constitutional: Negative for fever, positive for  weight change.  Respiratory: Negative for cough and shortness of breath.   Cardiovascular: Negative for chest pain or palpitations.  Gastrointestinal: Negative for abdominal pain, no bowel changes.  Musculoskeletal: Negative for gait problem or joint swelling.  Skin: Negative for rash.  Neurological: Negative for dizziness or headache.  No other specific complaints in a complete review of systems (except as listed in HPI above).   Objective  Vitals:   12/15/18 1504  BP: 128/85  Pulse: 94  Resp: 16  Temp: (!) 97.3 F (36.3 C)  TempSrc: Temporal  SpO2: 98%  Weight: 222 lb 8 oz (100.9 kg)  Height: 5\' 7"  (1.702 m)    Body mass index is 34.85 kg/m.  Physical Exam  Constitutional: Patient appears well-developed and well-nourished. Obese No distress.  HEENT: head atraumatic, normocephalic, pupils equal and reactive to light Cardiovascular: Normal rate, regular rhythm and normal heart sounds.  No murmur heard. No BLE edema. Pulmonary/Chest: Effort normal and breath sounds normal. No respiratory distress. Abdominal: Soft.  There is no tenderness. Psychiatric: Patient has a normal mood and affect. behavior is normal. Judgment and thought content normal.  PHQ2/9: Depression screen [place] 2/9 12/15/2018 10/17/2017 10/11/2016 04/11/2016 10/10/2015  Decreased Interest 0 0 0 0 0  Down, Depressed, Hopeless 0 0 0 0 0  PHQ - 2 Score 0 0 0 0 0  Altered sleeping 0 0 - - 3  Tired, decreased energy 0 1 - - 1  Change in appetite 0 1 - - 3  Feeling bad or failure about yourself  0 0 - - 0  Trouble concentrating 0 0 - - 0  Moving slowly or fidgety/restless 0 0 - - 0  Suicidal thoughts 0 0 - - 0  PHQ-9 Score 0 2 - - 7  Difficult doing work/chores - Not difficult at all - - -    phq 9 is  negative   Fall Risk: Fall Risk  12/15/2018 10/17/2017 10/17/2017 10/11/2016 04/11/2016  Falls in the past year? 0 No No No No  Number falls in past yr: 0 - - - -  Injury with Fall? 0 - - - -    Functional Status Survey: Is the patient deaf or have difficulty hearing?: No Does the patient have difficulty seeing, even when wearing glasses/contacts?: No Does the patient have difficulty concentrating, remembering, or making decisions?: No Does the patient have difficulty walking or climbing stairs?: No Does the patient have difficulty dressing or bathing?: No  Does the patient have difficulty doing errands alone such as visiting a doctor's office or shopping?: No   Assessment & Plan  1. Need for immunization against influenza  - Flu Vaccine QUAD 36+ mos IM  2. Hypertension, benign  - lisinopril (ZESTRIL) 10 MG tablet; Take 1 tablet (10 mg total) by mouth daily.  Dispense: 90 tablet; Refill: 1  3. Mixed hyperlipidemia  Recheck labs  4. Benign prostatic hyperplasia with lower urinary tract symptoms, symptom details unspecified  Keep follow up with Urologist

## 2018-12-16 LAB — CBC WITH DIFFERENTIAL/PLATELET
Absolute Monocytes: 589 cells/uL (ref 200–950)
Basophils Absolute: 37 cells/uL (ref 0–200)
Basophils Relative: 0.4 %
Eosinophils Absolute: 92 cells/uL (ref 15–500)
Eosinophils Relative: 1 %
HCT: 38.5 % (ref 38.5–50.0)
Hemoglobin: 12.7 g/dL — ABNORMAL LOW (ref 13.2–17.1)
Lymphs Abs: 2383 cells/uL (ref 850–3900)
MCH: 27.9 pg (ref 27.0–33.0)
MCHC: 33 g/dL (ref 32.0–36.0)
MCV: 84.6 fL (ref 80.0–100.0)
MPV: 10.3 fL (ref 7.5–12.5)
Monocytes Relative: 6.4 %
Neutro Abs: 6100 cells/uL (ref 1500–7800)
Neutrophils Relative %: 66.3 %
Platelets: 327 10*3/uL (ref 140–400)
RBC: 4.55 10*6/uL (ref 4.20–5.80)
RDW: 14.6 % (ref 11.0–15.0)
Total Lymphocyte: 25.9 %
WBC: 9.2 10*3/uL (ref 3.8–10.8)

## 2018-12-16 LAB — COMPLETE METABOLIC PANEL WITH GFR
AG Ratio: 1.5 (calc) (ref 1.0–2.5)
ALT: 23 U/L (ref 9–46)
AST: 19 U/L (ref 10–35)
Albumin: 4.2 g/dL (ref 3.6–5.1)
Alkaline phosphatase (APISO): 56 U/L (ref 35–144)
BUN: 20 mg/dL (ref 7–25)
CO2: 24 mmol/L (ref 20–32)
Calcium: 9 mg/dL (ref 8.6–10.3)
Chloride: 110 mmol/L (ref 98–110)
Creat: 0.94 mg/dL (ref 0.70–1.33)
GFR, Est African American: 102 mL/min/{1.73_m2} (ref >=60)
GFR, Est Non African American: 88 mL/min/{1.73_m2} (ref >=60)
Globulin: 2.8 g/dL (calc) (ref 1.9–3.7)
Glucose, Bld: 80 mg/dL (ref 65–99)
Potassium: 4.6 mmol/L (ref 3.5–5.3)
Sodium: 144 mmol/L (ref 135–146)
Total Bilirubin: 0.5 mg/dL (ref 0.2–1.2)
Total Protein: 7 g/dL (ref 6.1–8.1)

## 2018-12-16 LAB — LIPID PANEL
Cholesterol: 180 mg/dL (ref <200)
HDL: 60 mg/dL (ref >=40)
LDL Cholesterol (Calc): 97 mg/dL (calc)
Non-HDL Cholesterol (Calc): 120 mg/dL (calc) (ref <130)
Total CHOL/HDL Ratio: 3 (calc) (ref <5.0)
Triglycerides: 136 mg/dL (ref <150)

## 2018-12-16 LAB — HEMOGLOBIN A1C
Hgb A1c MFr Bld: 5.8 % of total Hgb — ABNORMAL HIGH (ref <5.7)
Mean Plasma Glucose: 120 (calc)
eAG (mmol/L): 6.6 (calc)

## 2018-12-16 LAB — PSA: PSA: 3.1 ng/mL (ref <=4.0)

## 2018-12-16 LAB — VITAMIN D 25 HYDROXY (VIT D DEFICIENCY, FRACTURES): Vit D, 25-Hydroxy: 28 ng/mL — ABNORMAL LOW (ref 30–100)

## 2018-12-17 ENCOUNTER — Telehealth: Payer: Self-pay | Admitting: Family Medicine

## 2018-12-17 NOTE — Telephone Encounter (Signed)
Copied from [place] [id number]. Topic: General - Inquiry >> Dec 17, 2018  2:49 PM Mathis Bud wrote: Reason for CRM: Patient is calling to get his lab results. Call back [phone]

## 2018-12-17 NOTE — Telephone Encounter (Signed)
No voicemail set up. Please give patient his lab results when he calls back.

## 2019-01-06 ENCOUNTER — Other Ambulatory Visit: Payer: BLUE CROSS/BLUE SHIELD

## 2019-01-13 ENCOUNTER — Ambulatory Visit: Payer: BLUE CROSS/BLUE SHIELD | Admitting: Urology

## 2019-01-13 NOTE — Progress Notes (Addendum)
01/14/2019 10:33 AM   Verneita Griffes [birth year] [id number]  Referring provider: Steele Sizer, MD [address] [place],  [place] [address]  CC: Gross hematuria  HPI: Mr. Manolis is a 58 year old male with a history of elevated PSA who presents today for gross hematuria and burning with urination.  He was seen by Dr. Diamantina [facility] in 01/2018 for an increase of his PSA from 2.4 to 3.2.  His repeated PSA returned at 4.0.  He elected to continue to follow his PSA trend and it has decreased.  He is a 58 year old relatively healthy man with a long history of urinary symptoms well controlled on Flomax who presents with a PSA of 3.2 from 2.4 previously.  He does not have a family history of prostate cancer.  He has a family history of lethal breast cancer in his mother.  He denies any gross hematuria or difficulty with erectile dysfunction.  He has never had a prostate biopsy previously.  There are no aggravating or alleviating factors.  Duration is a slow rise from 2017.  He states yesterday morning he had the sudden onset of left lower quadrant pain associated with gross hematuria.  Around lunchtime, he passed was described as a small "clump" that was dark red and the gross hematuria abated.  He still continues to have dysuria.  He states he did have a low-grade fever over the weekend.  Patient denies any gross hematuria or suprapubic/flank pain.  Patient denies any fevers, chills, nausea or vomiting.   He denies any history of nephrolithiasis.   His UA is nitrite positive, > 30 WBC's, > RBC's and many bacteria.  His PVR is 15 mL.   He is on a time constraint at this visit and needs to get back to work.  PMH: Past Medical History:  Diagnosis Date  . GERD (gastroesophageal reflux disease)   . Hyperlipidemia   . Hypertension     Surgical History: No past surgical history on file.  Allergies: No Known Allergies  Family History: Family History  Problem Relation Age of Onset  .  Breast cancer Mother   . Liver cancer Mother   . Heart disease Father   . Heart attack Father   . Heart attack Paternal Grandfather     Social History:  reports that he has never smoked. He has never used smokeless tobacco. He reports that he does not drink alcohol or use drugs.  ROS: Please see flowsheet from today's date for complete review of systems.  Physical Exam: BP 121/81   Pulse 84   Ht 5\' 7"  (1.702 m)   Wt 219 lb (99.3 kg)   BMI 34.30 kg/m   Constitutional:  Well nourished. Alert and oriented, No acute distress. HEENT: [place] AT, moist mucus membranes.  Trachea midline, no masses. Cardiovascular: No clubbing, cyanosis, or edema. Respiratory: Normal respiratory effort, no increased work of breathing. Neurologic: Grossly intact, no focal deficits, moving all 4 extremities. Psychiatric: Normal mood and affect.   Laboratory Data: Urinalysis Component     Latest Ref Rng & Units 01/14/2019  Specific Gravity, UA     1.005 - 1.030 >1.030 (H)  pH, UA     5.0 - 7.5 5.0  Color, UA     Yellow Yellow  Appearance Ur     Clear Cloudy (A)  Leukocytes,UA     Negative 1+ (A)  Protein,UA     Negative/Trace 1+ (A)  Glucose, UA     Negative Negative  Ketones, UA     Negative Negative  RBC, UA     Negative 3+ (A)  Bilirubin, UA     Negative Negative  Urobilinogen, Ur     0.2 - 1.0 mg/dL 0.2  Nitrite, UA     Negative Positive (A)  Microscopic Examination      See below:   Component     Latest Ref Rng & Units 01/14/2019  WBC, UA     0 - 5 /hpf >30 (A)  RBC     0 - 2 /hpf >30 (A)  Epithelial Cells (non renal)     0 - 10 /hpf 0-10  Bacteria, UA     None seen/Few Many (A)    Component     Latest Ref Rng & Units 11/26/2014 12/01/2015 01/04/2016 04/11/2016  PSA     < OR = 4.0 ng/mL 2.3 2.9 2.7 2.4   Component     Latest Ref Rng & Units 10/11/2016 10/17/2017 12/15/2018  PSA     < OR = 4.0 ng/mL 2.4 3.2 3.1    Pertinent Imaging: None to review  Assessment & Plan:     1. Gross hematuria Explained to the patient that the gross hematuria may be due to a urinary tract infection associated with passage of the kidney stone, but we need to have follow up to confirm that the hematuria resolves and no stones remain.   The UA is suspicious for infection and he is started on Septra DS empirically.  Urine is sent for culture and antibiotic will be adjusted if necessary when sensitivities are available. He will then return in 3 weeks for repeat UA, KUB and symptom recheck.  If hematuria persists, he will need to undergo hematuria work-up with CT urogram and cystoscopy. Patient is advised that if they should start to experience pain that is not able to be controlled with pain medication, intractable nausea and/or vomiting and/or fevers greater than 103 or shaking chills to contact the office immediately or seek treatment in the emergency department for emergent intervention.    2. Dysuria See above  3. History of increase in PSA velocity Most recent PSA was 3.1 (12/2018)  Continue to monitor on a yearly basis   Return in about 3 weeks (around 02/04/2019) for UA, KUB and office visit .    Zara Council, PA-C  [facility] [address] [place], [place] [address] [phone]

## 2019-01-14 ENCOUNTER — Encounter: Payer: Self-pay | Admitting: Urology

## 2019-01-14 ENCOUNTER — Other Ambulatory Visit: Payer: Self-pay

## 2019-01-14 ENCOUNTER — Ambulatory Visit: Payer: BC Managed Care – PPO | Admitting: Urology

## 2019-01-14 VITALS — BP 121/81 | HR 84 | Ht 67.0 in | Wt 219.0 lb

## 2019-01-14 DIAGNOSIS — R310 Gross hematuria: Secondary | ICD-10-CM | POA: Diagnosis not present

## 2019-01-14 DIAGNOSIS — Z87898 Personal history of other specified conditions: Secondary | ICD-10-CM | POA: Diagnosis not present

## 2019-01-14 DIAGNOSIS — R300 Dysuria: Secondary | ICD-10-CM | POA: Diagnosis not present

## 2019-01-14 LAB — URINALYSIS, COMPLETE
Bilirubin, UA: NEGATIVE
Glucose, UA: NEGATIVE
Ketones, UA: NEGATIVE
Nitrite, UA: POSITIVE — AB
Specific Gravity, UA: >1.03 — ABNORMAL HIGH (ref 1.005–1.030)
Urobilinogen, Ur: 0.2 mg/dL (ref 0.2–1.0)
pH, UA: 5 (ref 5.0–7.5)

## 2019-01-14 LAB — MICROSCOPIC EXAMINATION
RBC, Urine: >30 /hpf — AB (ref 0–2)
WBC, UA: >30 /hpf — AB (ref 0–5)

## 2019-01-14 LAB — BLADDER SCAN AMB NON-IMAGING

## 2019-01-14 MED ORDER — SULFAMETHOXAZOLE-TRIMETHOPRIM 800-160 MG PO TABS: 1.0000 | ORAL_TABLET | Freq: Two times a day (BID) | ORAL | 0 refills | Status: DC

## 2019-01-19 ENCOUNTER — Encounter: Payer: Self-pay | Admitting: Family Medicine

## 2019-01-19 LAB — CULTURE, URINE COMPREHENSIVE

## 2019-01-25 ENCOUNTER — Other Ambulatory Visit: Payer: Self-pay | Admitting: Family Medicine

## 2019-01-25 DIAGNOSIS — N401 Enlarged prostate with lower urinary tract symptoms: Secondary | ICD-10-CM

## 2019-01-29 ENCOUNTER — Ambulatory Visit: Payer: BLUE CROSS/BLUE SHIELD | Admitting: Urology

## 2019-02-02 NOTE — Progress Notes (Signed)
02/03/2019 4:02 PM   Melvin Williams [birth year] [id number]  Referring provider: Steele Sizer, MD [address] [place],  [place] [address]  CC: Gross hematuria  HPI: Melvin Williams is a 58 year old male with a history of elevated PSA and gross hematuria associated with an UTI who presents today for recheck.    He was seen by Dr. Diamantina Providence in 01/2018 for an increase of his PSA from 2.4 to 3.2.  His repeated PSA returned at 4.0.  He elected to continue to follow his PSA trend and it has decreased.  Most recent PSA 3.1 in October 2020.  He does not have a family history of prostate cancer.  He has a family history of lethal breast cancer in his mother.  He has never had a prostate biopsy previously.   He has a long history of urinary symptoms well controlled on Flomax.  Two weeks ago, he presented to the office with sudden onset of left lower quadrant pain associated with gross hematuria.  Around lunchtime, he passed what was described as a small "clump" that was dark red and the gross hematuria abated.  He still continues to have dysuria.  He states he did have a low-grade fever over the weekend.  He denies any history of nephrolithiasis.   His urine culture was positive for Klebsiella pneumoniae and he completed 7 days of Septra DS twice daily.  Today, he states he is no longer having dysuria or any further gross hematuria.  He is having baseline frequency, urgency and nocturia.  His UA is positive for 6-10 WBCs, but no RBCs were seen.   KUB noted bilateral renal calculi and cholelithiasis.  PMH: Past Medical History:  Diagnosis Date  . GERD (gastroesophageal reflux disease)   . Hyperlipidemia   . Hypertension     Surgical History: History reviewed. No pertinent surgical history.  Allergies: No Known Allergies  Family History: Family History  Problem Relation Age of Onset  . Breast cancer Mother   . Liver cancer Mother   . Heart disease Father   . Heart attack Father    . Heart attack Paternal Grandfather     Social History:  reports that he has never smoked. He has never used smokeless tobacco. He reports that he does not drink alcohol or use drugs.  ROS: Please see flowsheet from today's date for complete review of systems.  Physical Exam: BP 126/84   Pulse (!) 105   Ht 5\' 7"  (1.702 m)   Wt 226 lb 1.6 oz (102.6 kg)   BMI 35.41 kg/m   Constitutional:  Well nourished. Alert and oriented, No acute distress. HEENT: [place] AT, moist mucus membranes.  Trachea midline, no masses. Cardiovascular: No clubbing, cyanosis, or edema. Respiratory: Normal respiratory effort, no increased work of breathing. Neurologic: Grossly intact, no focal deficits, moving all 4 extremities. Psychiatric: Normal mood and affect.   Laboratory Data: Urinalysis Component     Latest Ref Rng & Units 02/03/2019  Specific Gravity, UA     1.005 - 1.030 >1.030 (H)  pH, UA     5.0 - 7.5 6.0  Color, UA     Yellow Yellow  Appearance Ur     Clear Hazy (A)  Leukocytes,UA     Negative Trace (A)  Protein,UA     Negative/Trace Negative  Glucose, UA     Negative Negative  Ketones, UA     Negative Negative  RBC, UA     Negative Negative  Bilirubin, UA     Negative Negative  Urobilinogen, Ur     0.2 - 1.0 mg/dL 0.2  Nitrite, UA     Negative Negative  Microscopic Examination      See below:   Component     Latest Ref Rng & Units 02/03/2019  WBC, UA     0 - 5 /hpf 6-10 (A)  RBC     0 - 2 /hpf None seen  Epithelial Cells (non renal)     0 - 10 /hpf None seen  Bacteria, UA     None seen/Few None seen   Component     Latest Ref Rng & Units 11/26/2014 12/01/2015 01/04/2016 04/11/2016  PSA     < OR = 4.0 ng/mL 2.3 2.9 2.7 2.4   Component     Latest Ref Rng & Units 10/11/2016 10/17/2017 12/15/2018  PSA     < OR = 4.0 ng/mL 2.4 3.2 3.1    Pertinent Imaging: CLINICAL DATA:  Hematuria and burning with urination  EXAM: ABDOMEN - 1 VIEW  COMPARISON:  11/07/2006   FINDINGS: Scattered large and small bowel gas is noted. Mild retained fecal material is seen. A lamellated gallstone is noted in the right upper quadrant. Scattered bilateral renal calculi are noted which are nonobstructive in nature. No definitive ureteral stones are seen. No acute bony abnormality is noted. Degenerative changes of lumbar spine are again seen.  IMPRESSION: Bilateral renal calculi.  Cholelithiasis.  Mild retained fecal material within the colon.   Electronically Signed   By: Inez Catalina M.D.   On: 02/03/2019 23:38 I have independently reviewed the films and appreciate the large cholelithiasis and the bilateral renal calculi.  Assessment & Plan:    1. Gross hematuria Resolved Likely due to passage of stone and UTI Patient will report any further gross hematuria and will return in 6 months for a UA recheck  2. Dysuria See above  3.  Bilateral nephrolithiasis He is asymptomatic at this visit and is not interested in any treatment for the stones at this time He will return in 6 months for KUB Patient is advised that if they should start to experience pain that is not able to be controlled with pain medication, intractable nausea and/or vomiting and/or fevers greater than 103 or shaking chills to contact the office immediately or seek treatment in the emergency department for emergent intervention.    4.  Cholelithiasis Patient is aware of the gallstone and he is not interested in treatment  5. History of increase in PSA velocity Most recent PSA was 3.1 (12/2018)  Continue to monitor on a yearly basis   Return in about 6 months (around 08/03/2019) for KUB and UA .    Zara Council, PA-C  [facility] [address], [place], [place] [address] [phone]

## 2019-02-03 ENCOUNTER — Ambulatory Visit
Admission: RE | Admit: 2019-02-03 | Discharge: 2019-02-03 | Disposition: A | Discharge status: Home / Self Care | Payer: BC Managed Care – PPO | Source: Ambulatory Visit | Attending: Urology | Admitting: Urology

## 2019-02-03 ENCOUNTER — Encounter: Payer: Self-pay | Admitting: Urology

## 2019-02-03 ENCOUNTER — Other Ambulatory Visit: Payer: Self-pay

## 2019-02-03 ENCOUNTER — Ambulatory Visit (INDEPENDENT_AMBULATORY_CARE_PROVIDER_SITE_OTHER): Payer: BC Managed Care – PPO | Admitting: Urology

## 2019-02-03 ENCOUNTER — Other Ambulatory Visit: Payer: Self-pay | Admitting: Family Medicine

## 2019-02-03 VITALS — BP 126/84 | HR 105 | Ht 67.0 in | Wt 226.1 lb

## 2019-02-03 DIAGNOSIS — N200 Calculus of kidney: Secondary | ICD-10-CM | POA: Insufficient documentation

## 2019-02-03 DIAGNOSIS — R310 Gross hematuria: Secondary | ICD-10-CM | POA: Diagnosis not present

## 2019-02-03 DIAGNOSIS — K8020 Calculus of gallbladder without cholecystitis without obstruction: Secondary | ICD-10-CM | POA: Diagnosis not present

## 2019-02-03 DIAGNOSIS — R300 Dysuria: Secondary | ICD-10-CM

## 2019-02-04 LAB — URINALYSIS, COMPLETE
Bilirubin, UA: NEGATIVE
Glucose, UA: NEGATIVE
Ketones, UA: NEGATIVE
Nitrite, UA: NEGATIVE
Protein,UA: NEGATIVE
RBC, UA: NEGATIVE
Specific Gravity, UA: >1.03 — ABNORMAL HIGH (ref 1.005–1.030)
Urobilinogen, Ur: 0.2 mg/dL (ref 0.2–1.0)
pH, UA: 6 (ref 5.0–7.5)

## 2019-02-04 LAB — MICROSCOPIC EXAMINATION
Bacteria, UA: NONE SEEN
Epithelial Cells (non renal): NONE SEEN /hpf (ref 0–10)
RBC, Urine: NONE SEEN /hpf (ref 0–2)

## 2019-04-08 ENCOUNTER — Ambulatory Visit: Payer: Self-pay

## 2019-04-08 DIAGNOSIS — R0789 Other chest pain: Secondary | ICD-10-CM | POA: Diagnosis not present

## 2019-04-08 DIAGNOSIS — R202 Paresthesia of skin: Secondary | ICD-10-CM | POA: Diagnosis not present

## 2019-04-08 NOTE — Telephone Encounter (Signed)
Incoming call from Patient with complaint of feeling a twinge on his left side .  Patient was at work states weight varies on how heavy the boxes weigh.  Weight  Can be up to 40lbs.  Patient states that it feels like a pulled muscle.  Onset was about two days ago.  Rates the pain mild.  Denies cardiac risk factors or Pulmonary risk factors.  Patient feels its from lifting heavy Packaging.  Pt. Advised to call back if Sx worsen.  Voiced understanding.  Denies any other Sx.         Reason for Disposition . Chest pain(s) lasting a few seconds  Answer Assessment - Initial Assessment Questions 1. LOCATION: "Where does it hurt?"       Feels pulled muscle on left side 2. RADIATION: "Does the pain go anywhere else?" (e.g., into neck, jaw, arms, back)    3. ONSET: "When did the chest pain begin?" (Minutes, hours or days)     A day or so.  2 days 4. PATTERN "Does the pain come and go, or has it been constant since it started?"  "Does it get worse with exertion?"      Ongoing a twinge all the time 5. DURATION: "How long does it last" (e.g., seconds, minutes, hours)     6. SEVERITY: "How bad is the pain?"  (e.g., Scale 1-10; mild, moderate, or severe)    - MILD (1-3): doesn't interfere with normal activities     - MODERATE (4-7): interferes with normal activities or awakens from sleep    - SEVERE (8-10): excruciating pain, unable to do any normal activities       mild 7. CARDIAC RISK FACTORS: "Do you have any history of heart problems or risk factors for heart disease?" (e.g., angina, prior heart attack; diabetes, high blood pressure, high cholesterol, smoker, or strong family history of heart disease)    denies 8. PULMONARY RISK FACTORS: "Do you have any history of lung disease?"  (e.g., blood clots in lung, asthma, emphysema, birth control pills)     denies 9. CAUSE: "What do you think is causing the chest pain?"    Lifting something heavy 10. OTHER SYMPTOMS: "Do you have any other symptoms?"  (e.g., dizziness, nausea, vomiting, sweating, fever, difficulty breathing, cough)      denies 11. PREGNANCY: "Is there any chance you are pregnant?" "When was your last menstrual period?"       na  Protocols used: CHEST PAIN-A-AH

## 2019-04-08 NOTE — Telephone Encounter (Signed)
Pt vm not set up . Tried calling to give pt an appt if they wanted

## 2019-04-25 ENCOUNTER — Other Ambulatory Visit: Payer: Self-pay | Admitting: Family Medicine

## 2019-04-25 DIAGNOSIS — N401 Enlarged prostate with lower urinary tract symptoms: Secondary | ICD-10-CM

## 2019-04-25 NOTE — Telephone Encounter (Signed)
Requested Prescriptions  Pending Prescriptions Disp Refills  . tamsulosin (FLOMAX) 0.4 MG CAPS capsule [Pharmacy Med Name: TAMSULOSIN 0.4MG  CAPSULES] 90 capsule 0    Sig: TAKE 1 CAPSULE(0.4 MG) BY MOUTH DAILY     Urology: Alpha-Adrenergic Blocker Passed - 04/25/2019  8:09 AM      Passed - Last BP in normal range    BP Readings from Last 1 Encounters:  02/03/19 126/84         Passed - Valid encounter within last 12 months    Recent Outpatient Visits          4 months ago Need for immunization against influenza   [facility] Steele Sizer, MD   1 year ago Well adult exam   [facility] Steele Sizer, MD   2 years ago Well adult exam   [facility] Steele Sizer, MD   3 years ago Hypertension, benign   [facility] Steele Sizer, MD   3 years ago PSA elevation   [facility] Steele Sizer, MD      Future Appointments            In 1 month Ancil Boozer, Drue Stager, MD [facility], [place]   In 3 months McGowan, Gordan Payment [facility]

## 2019-04-30 ENCOUNTER — Other Ambulatory Visit: Payer: Self-pay | Admitting: Family Medicine

## 2019-04-30 DIAGNOSIS — N401 Enlarged prostate with lower urinary tract symptoms: Secondary | ICD-10-CM

## 2019-05-04 ENCOUNTER — Other Ambulatory Visit: Payer: Self-pay | Admitting: Family Medicine

## 2019-05-04 DIAGNOSIS — N401 Enlarged prostate with lower urinary tract symptoms: Secondary | ICD-10-CM

## 2019-05-04 MED ORDER — TAMSULOSIN HCL 0.4 MG PO CAPS: 0.4000 mg | ORAL_CAPSULE | Freq: Every day | ORAL | 0 refills | Status: DC

## 2019-06-10 ENCOUNTER — Ambulatory Visit: Payer: BC Managed Care – PPO | Admitting: Internal Medicine

## 2019-06-10 ENCOUNTER — Encounter: Payer: Self-pay | Admitting: Internal Medicine

## 2019-06-10 ENCOUNTER — Other Ambulatory Visit: Payer: Self-pay

## 2019-06-10 VITALS — BP 118/76 | HR 94 | Temp 97.5°F | Resp 16 | Ht 67.0 in | Wt 236.1 lb

## 2019-06-10 DIAGNOSIS — R7303 Prediabetes: Secondary | ICD-10-CM

## 2019-06-10 DIAGNOSIS — I10 Essential (primary) hypertension: Secondary | ICD-10-CM

## 2019-06-10 DIAGNOSIS — L03115 Cellulitis of right lower limb: Secondary | ICD-10-CM | POA: Diagnosis not present

## 2019-06-10 DIAGNOSIS — R600 Localized edema: Secondary | ICD-10-CM | POA: Diagnosis not present

## 2019-06-10 DIAGNOSIS — L03116 Cellulitis of left lower limb: Secondary | ICD-10-CM | POA: Diagnosis not present

## 2019-06-10 DIAGNOSIS — Z6836 Body mass index (BMI) 36.0-36.9, adult: Secondary | ICD-10-CM

## 2019-06-10 DIAGNOSIS — D649 Anemia, unspecified: Secondary | ICD-10-CM | POA: Diagnosis not present

## 2019-06-10 MED ORDER — CEPHALEXIN 500 MG PO CAPS: 500.0000 mg | ORAL_CAPSULE | Freq: Four times a day (QID) | ORAL | 0 refills | Status: AC

## 2019-06-10 NOTE — Patient Instructions (Signed)

## 2019-06-10 NOTE — Progress Notes (Signed)
Patient ID: Melvin Williams, male    DOB: 14-Mar-1960, 59 y.o.   MRN: [id number]  PCP: Steele Sizer, MD  Chief Complaint  Patient presents with  . Leg Swelling    both legs swelling and red, skin very tight, left leg is worse    Subjective:   Melvin Williams is a 59 y.o. male, presents to clinic with CC of the following:  Chief Complaint  Patient presents with  . Leg Swelling    both legs swelling and red, skin very tight, left leg is worse    HPI:  Patient is a 59 year old male patient of Dr. Ancil Boozer, who follows up today with complaints of lower extremity swelling and redness.  His last visit with Dr. Ancil Boozer was 12/15/2018 with the following issues addressed: HTN: he had lost weight on his last visit and was off lisinopril and bp was at goal, however he is not as compliant with his diet , his weight is up over 20 lbs since last visit,  we will avoid HCTZ because BPH, he has been out of medication for the past month and bp is well controlled, he has gained weight, we will resume lisinopril 10 mg daily  BPH: he feels like medication has helped with symptoms and wants to continue flomax, we will recheck PSA . Also advised yearly CPE. He has follow up with Urologist Nov 2020  Hyperlipidemia: he has not been taking medications, we will recheck labs today and decide if he needs to resume medications Obesity: he has gained 28 lbs in the past year, he is not following a healthy diet lately, no physically active, he has a history of pre-diabetes that was controlled with dietary modification. No polyphagia, polydipsia or polyuria.   BP Readings from Last 3 Encounters:  06/10/19 118/76  02/03/19 126/84  01/14/19 121/81   Wt Readings from Last 3 Encounters:  06/10/19 236 lb 1.6 oz (107.1 kg)  02/03/19 226 lb 1.6 oz (102.6 kg)  01/14/19 219 lb (99.3 kg)   Lab Results  Component Value Date   CHOL 180 12/15/2018   HDL 60 12/15/2018   LDLCALC 97 12/15/2018   TRIG 136 12/15/2018     CHOLHDL 3.0 12/15/2018   Lab Results  Component Value Date   HGBA1C 5.8 (H) 12/15/2018   He noted over the past couple weeks, he has noticed his legs getting a little more swollen, and in the last 2 to 3 days, the left leg has been worse than the right.  Also in the last 2 to 3 days, he has noticed increased redness in both lower extremities, more diffuse, although only milder involvement on the right more below the shin level, with increased involvement of redness in the left lower extremity involving the distal two thirds of the lower extremity.  He denies any open areas of concern, although on the distal calf of the left lower extremity, he noticed an area that almost felt like a spider bite. He notes the left lower extremity is now more swollen than the right, more red as noted above, although denies any marked pain.  He states he mowed his yard yesterday without discomfort.  He works for transportation department, and is on his feet most of the day.  He noted at night when he rested in a recliner, it sometimes feels a little uncomfortable more than markedly painful.  When the rash first started, he noted he has a history of eczema, and just thought it might  be that. He denies any fevers, not feeling ill, no history of blood clots in his past, no chest pains, shortness of breath, palpitations. He has a history of prediabetes, with his last A1c 5.8 in October. He has gained further weight since then, over 10 pounds.  He notes he needs to try to watch his sugars more with his diet. He had a UTI in November, treated with a Bactrim DS product, and a history of a kidney stone. He denied any history of MRSA.    Patient Active Problem List   Diagnosis Date Noted  . Morbid obesity, unspecified obesity type ([place]) 10/17/2017  . Benign prostatic hyperplasia without lower urinary tract symptoms 04/11/2016  . PSA elevation 04/11/2016  . Pre-diabetes 04/11/2016  . Hypertension, benign 10/10/2015  .  Hyperlipidemia 10/10/2015  . Hyperglycemia 10/10/2015  . Vitamin D deficiency 10/10/2015  . Microalbuminuria 10/10/2015  . Overweight (BMI 25.0-29.9) 10/10/2015  . Eczema 10/10/2015  . Seborrhea 10/10/2015  . Fatty liver 10/10/2015  . Cholelithiasis 10/10/2015  . Cervical disc disease 10/10/2015      Current Outpatient Medications:  .  aspirin EC 81 MG tablet, Take by mouth., Disp: , Rfl:  .  cholecalciferol (VITAMIN D) 1000 units tablet, Take 1,000 Units by mouth daily., Disp: , Rfl:  .  ibuprofen (ADVIL,MOTRIN) 200 MG tablet, Take 200 mg by mouth as needed., Disp: , Rfl:  .  lisinopril (ZESTRIL) 10 MG tablet, Take 1 tablet (10 mg total) by mouth daily., Disp: 90 tablet, Rfl: 1 .  tamsulosin (FLOMAX) 0.4 MG CAPS capsule, Take 1 capsule (0.4 mg total) by mouth daily., Disp: 90 capsule, Rfl: 0   No Known Allergies   History reviewed. No pertinent surgical history.   Family History  Problem Relation Age of Onset  . Breast cancer Mother   . Liver cancer Mother   . Heart disease Father   . Heart attack Father   . Heart attack Paternal Grandfather      Social History   Tobacco Use  . Smoking status: Never Smoker  . Smokeless tobacco: Never Used  Substance Use Topics  . Alcohol use: No    With staff assistance, above reviewed with the patient today.  ROS: As per HPI, otherwise no specific complaints on a limited and focused system review   No results found for this or any previous visit (from the past 72 hour(s)).   PHQ2/9: Depression screen [facility] 2/9 06/10/2019 12/15/2018 10/17/2017 10/11/2016 04/11/2016  Decreased Interest 0 0 0 0 0  Down, Depressed, Hopeless 1 0 0 0 0  PHQ - 2 Score 1 0 0 0 0  Altered sleeping 1 0 0 - -  Tired, decreased energy 0 0 1 - -  Change in appetite 0 0 1 - -  Feeling bad or failure about yourself  0 0 0 - -  Trouble concentrating 1 0 0 - -  Moving slowly or fidgety/restless 0 0 0 - -  Suicidal thoughts 0 0 0 - -  PHQ-9 Score 3 0 2 - -    Difficult doing work/chores Not difficult at all - Not difficult at all - -   PHQ-2/9 Result reviewed   Fall Risk: Fall Risk  06/10/2019 12/15/2018 10/17/2017 10/17/2017 10/11/2016  Falls in the past year? 0 0 No No No  Number falls in past yr: 0 0 - - -  Injury with Fall? 0 0 - - -      Objective:   Vitals:  06/10/19 1058  BP: 118/76  Pulse: 94  Resp: 16  Temp: (!) 97.5 F (36.4 C)  TempSrc: Temporal  SpO2: 98%  Weight: 236 lb 1.6 oz (107.1 kg)  Height: 5\' 7"  (1.702 m)    Body mass index is 36.98 kg/m.  Physical Exam   NAD, masked, obese HEENT - [place]/AT, sclera anicteric, conj - non-inj'ed,  pharynx clear Neck - supple, carotids 2+ and = without bruits Car - RRR without m/g/r, not tachycardic Pulm- RR and effort normal at rest, CTA without wheeze or rales No inguinal adenopathy noted bilateral Skin-diffuse erythema noted on the right lower extremity mostly in the distal one third with no raised lesions present, no blistering, no open lesions identified, diffuse erythema was present in the left lower extremity involving the distal two thirds, with at the upper border some mild petechiae and hemorrhage in the erythematous skin, and some scattered superficial bullae.  No open lesions identified on the left lower extremity.  A small focal area of tenderness noted in the posterior distal left calf above the ankle, with no obvious puncture wound identified, and no raised lesion at this area. Ext - 1+ LE edema on the right, 1-2+ lower extremity edema on the left, with the edema present in the area of the erythema.  No marked increased warmth noted, no cords identified, no tenderness palpating the calfs bilaterally. Homans negative No swelling of the feet bilaterally, and no erythema of the feet, with his dorsalis pedis pulses palpable, toenails with onychomycosis bilateral Neuro/psychiatric - affect was not flat, appropriate with conversation  Alert   Speech normal       Assessment &  Plan:   1. Cellulitis of both lower extremities Educated, noted concern with infection presently, and feel more consistent with cellulitis than an erysipelas.  Will check a CBC in combination with the other labs, add Keflex 4 times daily, emphasized not to scratch or touch the areas and the importance of keeping the legs elevated as much as possible to help.  MRSA seems less likely without an abscess component, and he has no history of MRSA.  Did emphasize to him if he sees this progressing rapidly in the next day or 2 with any streaks of redness progressing up the leg, if he develops any fevers, feeling more ill, he needs to be seen more emergently in an ER setting and he was understanding of that. - CBC with Differential/Platelet - cephALEXin (KEFLEX) 500 MG capsule; Take 1 capsule (500 mg total) by mouth 4 (four) times daily for 7 days.  Dispense: 28 capsule; Refill: 0  2. Bilateral lower extremity edema Likely related to the above, and DVT seems less likely, and discussed that with him.  Has gained more weight since his last visit, and noted concerns with that as well.  Needs to focus on weight management noting his other medical conditions as well.  May have an element of dependent edema contributing.  We will check some lab test as well including a comp panel.  Await those results. Emphasized the importance of keeping his legs elevated as much as possible over the next few days and the importance of that. - CBC with Differential/Platelet - COMPLETE METABOLIC PANEL WITH GFR  3. Essential hypertension Blood pressure okay today.  On lisinopril and continue.  He notes he does not check his blood pressures routinely at home. - COMPLETE METABOLIC PANEL WITH GFR  4. Class 2 severe obesity due to excess calories with serious comorbidity and  body mass index (BMI) of 36.0 to 36.9 in adult [facility]) As noted above, concerned with his continued weight gain noted.  5. Prediabetes We will recheck an A1c  today as well, as concern for prediabetes progressing to diabetes with his continued weight gain noted. Await that result. - Hemoglobin A1c  He has an appointment scheduled for next Monday, and to keep that appointment and make sure this is improving, as well as the plan follow-up with Dr. Ancil Boozer. The patient was understanding if this is rapidly progressing, or other symptoms arising as noted above, the potential need to be seen sooner in a more emergent setting.     Towanda Malkin, MD 06/10/19 11:22 AM

## 2019-06-15 ENCOUNTER — Other Ambulatory Visit: Payer: Self-pay

## 2019-06-15 ENCOUNTER — Encounter: Payer: Self-pay | Admitting: Family Medicine

## 2019-06-15 ENCOUNTER — Ambulatory Visit (INDEPENDENT_AMBULATORY_CARE_PROVIDER_SITE_OTHER): Payer: BC Managed Care – PPO | Admitting: Family Medicine

## 2019-06-15 VITALS — BP 128/82 | HR 103 | Temp 96.9°F | Ht 67.0 in | Wt 230.6 lb

## 2019-06-15 DIAGNOSIS — L989 Disorder of the skin and subcutaneous tissue, unspecified: Secondary | ICD-10-CM

## 2019-06-15 DIAGNOSIS — Z23 Encounter for immunization: Secondary | ICD-10-CM

## 2019-06-15 DIAGNOSIS — N401 Enlarged prostate with lower urinary tract symptoms: Secondary | ICD-10-CM | POA: Diagnosis not present

## 2019-06-15 DIAGNOSIS — D649 Anemia, unspecified: Secondary | ICD-10-CM | POA: Diagnosis not present

## 2019-06-15 DIAGNOSIS — Z0000 Encounter for general adult medical examination without abnormal findings: Secondary | ICD-10-CM | POA: Diagnosis not present

## 2019-06-15 NOTE — Patient Instructions (Signed)
Preventive Care 60-60 Years Old, Male Preventive care refers to lifestyle choices and visits with your health care provider that can promote health and wellness. This includes:  A yearly physical exam. This is also called an annual well check.  Regular dental and eye exams.  Immunizations.  Screening for certain conditions.  Healthy lifestyle choices, such as eating a healthy diet, getting regular exercise, not using drugs or products that contain nicotine and tobacco, and limiting alcohol use. What can I expect for my preventive care visit? Physical exam Your health care provider will check:  Height and weight. These may be used to calculate body mass index (BMI), which is a measurement that tells if you are at a healthy weight.  Heart rate and blood pressure.  Your skin for abnormal spots. Counseling Your health care provider may ask you questions about:  Alcohol, tobacco, and drug use.  Emotional well-being.  Home and relationship well-being.  Sexual activity.  Eating habits.  Work and work Statistician. What immunizations do I need?  Influenza (flu) vaccine  This is recommended every year. Tetanus, diphtheria, and pertussis (Tdap) vaccine  You may need a Td booster every 10 years. Varicella (chickenpox) vaccine  You may need this vaccine if you have not already been vaccinated. Zoster (shingles) vaccine  You may need this after age 60. Measles, mumps, and rubella (MMR) vaccine  You may need at least one dose of MMR if you were born in 1957 or later. You may also need a second dose. Pneumococcal conjugate (PCV13) vaccine  You may need this if you have certain conditions and were not previously vaccinated. Pneumococcal polysaccharide (PPSV23) vaccine  You may need one or two doses if you smoke cigarettes or if you have certain conditions. Meningococcal conjugate (MenACWY) vaccine  You may need this if you have certain conditions. Hepatitis A  vaccine  You may need this if you have certain conditions or if you travel or work in places where you may be exposed to hepatitis A. Hepatitis B vaccine  You may need this if you have certain conditions or if you travel or work in places where you may be exposed to hepatitis B. Haemophilus influenzae type b (Hib) vaccine  You may need this if you have certain risk factors. Human papillomavirus (HPV) vaccine  If recommended by your health care provider, you may need three doses over 6 months. You may receive vaccines as individual doses or as more than one vaccine together in one shot (combination vaccines). Talk with your health care provider about the risks and benefits of combination vaccines. What tests do I need? Blood tests  Lipid and cholesterol levels. These may be checked every 5 years, or more frequently if you are over 60 years old.  Hepatitis C test.  Hepatitis B test. Screening  Lung cancer screening. You may have this screening every year starting at age 60 if you have a 30-pack-year history of smoking and currently smoke or have quit within the past 15 years.  Prostate cancer screening. Recommendations will vary depending on your family history and other risks.  Colorectal cancer screening. All adults should have this screening starting at age 60 and continuing until age 2. Your health care provider may recommend screening at age 60 if you are at increased risk. You will have tests every 1-10 years, depending on your results and the type of screening test.  Diabetes screening. This is done by checking your blood sugar (glucose) after you have not eaten  for a while (fasting). You may have this done every 1-3 years.  Sexually transmitted disease (STD) testing. Follow these instructions at home: Eating and drinking  Eat a diet that includes fresh fruits and vegetables, whole grains, lean protein, and low-fat dairy products.  Take vitamin and mineral supplements as  recommended by your health care provider.  Do not drink alcohol if your health care provider tells you not to drink.  If you drink alcohol: ? Limit how much you have to 0-2 drinks a day. ? Be aware of how much alcohol is in your drink. In the [place] one drink equals one 12 oz bottle of beer (355 mL), one 5 oz glass of wine (148 mL), or one 1 oz glass of hard liquor (44 mL). Lifestyle  Take daily care of your teeth and gums.  Stay active. Exercise for at least 30 minutes on 5 or more days each week.  Do not use any products that contain nicotine or tobacco, such as cigarettes, e-cigarettes, and chewing tobacco. If you need help quitting, ask your health care provider.  If you are sexually active, practice safe sex. Use a condom or other form of protection to prevent STIs (sexually transmitted infections).  Talk with your health care provider about taking a low-dose aspirin every day starting at age 60. What's next?  Go to your health care provider once a year for a well check visit.  Ask your health care provider how often you should have your eyes and teeth checked.  Stay up to date on all vaccines. This information is not intended to replace advice given to you by your health care provider. Make sure you discuss any questions you have with your health care provider. Document Revised: 02/20/2018 Document Reviewed: 02/20/2018 Elsevier Patient Education  2020 [employer].

## 2019-06-15 NOTE — Progress Notes (Signed)
Name: Melvin Williams   MRN: [id number]    DOB: [birth year]   Date:06/15/2019       Progress Note  Subjective  Chief Complaint  Chief Complaint  Patient presents with  . 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  Depression: phq 9 is negative Depression screen [facility] 2/9 06/15/2019 06/10/2019 12/15/2018 10/17/2017 10/11/2016  Decreased Interest 0 0 0 0 0  Down, Depressed, Hopeless 0 1 0 0 0  PHQ - 2 Score 0 1 0 0 0  Altered sleeping 1 1 0 0 -  Tired, decreased energy 1 0 0 1 -  Change in appetite 1 0 0 1 -  Feeling bad or failure about yourself  0 0 0 0 -  Trouble concentrating 0 1 0 0 -  Moving slowly or fidgety/restless 0 0 0 0 -  Suicidal thoughts 0 0 0 0 -  PHQ-9 Score 3 3 0 2 -  Difficult doing work/chores Not difficult at all Not difficult at all - Not difficult at all -    Hypertension:  BP Readings from Last 3 Encounters:  06/15/19 128/82  06/10/19 118/76  02/03/19 126/84    Obesity: Wt Readings from Last 3 Encounters:  06/15/19 230 lb 9.6 oz (104.6 kg)  06/10/19 236 lb 1.6 oz (107.1 kg)  02/03/19 226 lb 1.6 oz (102.6 kg)   BMI Readings from Last 3 Encounters:  06/15/19 36.12 kg/m  06/10/19 36.98 kg/m  02/03/19 35.41 kg/m     Lipids:  Lab Results  Component Value Date   CHOL 180 12/15/2018   CHOL 184 10/17/2017   CHOL 176 10/11/2016   Lab Results  Component Value Date   HDL 60 12/15/2018   HDL 67 10/17/2017   HDL 58 10/11/2016   Lab Results  Component Value Date   LDLCALC 97 12/15/2018   LDLCALC 103 (H) 10/17/2017   LDLCALC 107 (H) 10/11/2016   Lab Results  Component Value Date   TRIG 136 12/15/2018   TRIG 56 10/17/2017   TRIG 53 10/11/2016   Lab Results  Component  Value Date   CHOLHDL 3.0 12/15/2018   CHOLHDL 2.7 10/17/2017   CHOLHDL 3.0 10/11/2016   No results found for: LDLDIRECT Glucose:  Glucose, Bld  Date Value Ref Range Status  06/10/2019 96 65 - 99 mg/dL Final    Comment:    .            Fasting reference interval .   12/15/2018 80 65 - 99 mg/dL Final    Comment:    .            Fasting reference interval .   10/17/2017 95 65 - 99 mg/dL Final    Comment:    .            Fasting reference interval .       [facility] Visit from 06/15/2019 in [facility]  AUDIT-C Score  0      Single STD testing and prevention (HIV/chl/gon/syphilis): N/A Hep C: 10/2015   Skin cancer: Discussed monitoring for atypical lesions Colorectal cancer: 07/2010 Prostate cancer: repeat in the fall .  IPSS Questionnaire (  AUA-7): Over the past month.   1)  How often have you had a sensation of not emptying your bladder completely after you finish urinating?  0 - Not at all  2)  How often have you had to urinate again less than two hours after you finished urinating? 3- About half the time  3)  How often have you found you stopped and started again several times when you urinated?  0 - Not at all  4) How difficult have you found it to postpone urination?  5 - Almost always  5) How often have you had a weak urinary stream?  5 - Almost always  6) How often have you had to push or strain to begin urination?  0 - Not at all  7) How many times did you most typically get up to urinate from the time you went to bed until the time you got up in the morning?  3 - 3 times  Total score:  0-7 mildly symptomatic   8-19 moderately symptomatic   20-35 severely symptomatic   Lab Results  Component Value Date   PSA 3.1 12/15/2018   PSA 3.2 10/17/2017   PSA 2.4 10/11/2016    Lung cancer:   Low Dose CT Chest recommended if Age 37-80 years, 30 pack-year currently smoking OR have quit w/in 15years. Patient does not qualify.   AAA:  The USPSTF  recommends one-time screening with ultrasonography in men ages 9 to 50 years who have ever smoked ECG:  10/2016   Advanced Care Planning: A voluntary discussion about advance care planning including the explanation and discussion of advance directives.  Discussed health care proxy and Living will, and the patient was able to identify a health care proxy as sister - Skip Mayer.  Patient does not have a living will at present time. If patient does have living will, I have requested they bring this to the clinic to be scanned in to their chart.  Patient Active Problem List   Diagnosis Date Noted  . Cellulitis of both lower extremities 06/10/2019  . Morbid obesity, unspecified obesity type ([place]) 10/17/2017  . Benign prostatic hyperplasia without lower urinary tract symptoms 04/11/2016  . PSA elevation 04/11/2016  . Pre-diabetes 04/11/2016  . Hypertension, benign 10/10/2015  . Hyperlipidemia 10/10/2015  . Hyperglycemia 10/10/2015  . Vitamin D deficiency 10/10/2015  . Microalbuminuria 10/10/2015  . Overweight (BMI 25.0-29.9) 10/10/2015  . Eczema 10/10/2015  . Seborrhea 10/10/2015  . Fatty liver 10/10/2015  . Cholelithiasis 10/10/2015  . Cervical disc disease 10/10/2015    History reviewed. No pertinent surgical history.  Family History  Problem Relation Age of Onset  . Breast cancer Mother   . Liver cancer Mother   . Heart disease Father   . Heart attack Father   . Heart attack Paternal Grandfather     Social History   Socioeconomic History  . Marital status: Single    Spouse name: Not on file  . Number of children: 0  . Years of education: Not on file  . Highest education level: High school graduate  Occupational History  . Occupation: Cabin crew   Tobacco Use  . Smoking status: Never Smoker  . Smokeless tobacco: Never Used  Substance and Sexual Activity  . Alcohol use: No  . Drug use: No  . Sexual activity: Never  Other Topics Concern  . Not on  file  Social History Narrative   Promoted to transportation supervisor    Social Determinants of  Health   Financial Resource Strain: Low Risk   . Difficulty of Paying Living Expenses: Not hard at all  Food Insecurity: No Food Insecurity  . Worried About Charity fundraiser in the Last Year: Never true  . Ran Out of Food in the Last Year: Never true  Transportation Needs:   . Lack of Transportation (Medical):   Marland Kitchen Lack of Transportation (Non-Medical):   Physical Activity: Unknown  . Days of Exercise per Week: Not on file  . Minutes of Exercise per Session: 0 min  Stress:   . Feeling of Stress :   Social Connections: Unknown  . Frequency of Communication with Friends and Family: More than three times a week  . Frequency of Social Gatherings with Friends and Family: More than three times a week  . Attends Religious Services: Not on file  . Active Member of Clubs or Organizations: Not on file  . Attends Archivist Meetings: Not on file  . Marital Status: Not on file  Intimate Partner Violence:   . Fear of Current or Ex-Partner:   . Emotionally Abused:   Marland Kitchen Physically Abused:   . Sexually Abused:      Current Outpatient Medications:  .  aspirin EC 81 MG tablet, Take by mouth., Disp: , Rfl:  .  cephALEXin (KEFLEX) 500 MG capsule, Take 1 capsule (500 mg total) by mouth 4 (four) times daily for 7 days., Disp: 28 capsule, Rfl: 0 .  cholecalciferol (VITAMIN D) 1000 units tablet, Take 1,000 Units by mouth daily., Disp: , Rfl:  .  ibuprofen (ADVIL,MOTRIN) 200 MG tablet, Take 200 mg by mouth as needed., Disp: , Rfl:  .  lisinopril (ZESTRIL) 10 MG tablet, Take 1 tablet (10 mg total) by mouth daily., Disp: 90 tablet, Rfl: 1 .  tamsulosin (FLOMAX) 0.4 MG CAPS capsule, Take 1 capsule (0.4 mg total) by mouth daily., Disp: 90 capsule, Rfl: 0  No Known Allergies   ROS  Constitutional: Negative for fever or weight change.  Respiratory: Negative for cough and shortness of breath.    Cardiovascular: Negative for chest pain or palpitations.  Gastrointestinal: Negative for abdominal pain, no bowel changes.  Musculoskeletal: Negative for gait problem or joint swelling.  Skin: Negative for rash.  Neurological: Negative for dizziness or headache.  No other specific complaints in a complete review of systems (except as listed in HPI above).  Objective  Vitals:   06/15/19 0812 06/15/19 0813  BP: 128/82   Pulse: (!) 132 (!) 103  Temp: (!) 96.9 F (36.1 C)   SpO2: 94%   Weight: 230 lb 9.6 oz (104.6 kg)   Height: 5\' 7"  (1.702 m)     Body mass index is 36.12 kg/m.  Physical Exam  Constitutional: Patient appears well-developed and well-nourished. No distress.  HENT: Head: Normocephalic and atraumatic. Ears: B TMs ok, no erythema or effusion; Nose: not done. Mouth/Throat: not done Eyes: Conjunctivae and EOM are normal. Pupils are equal, round, and reactive to light. No scleral icterus.  Neck: Normal range of motion. Neck supple. No JVD present. No thyromegaly present.  Cardiovascular: Normal rate, regular rhythm and normal heart sounds.  No murmur heard.  Edema both legs, mild erythema noticed, worse on LLE and some induration, per patient much better than last week Pulmonary/Chest: Effort normal and breath sounds normal. No respiratory distress. Abdominal: Soft. Bowel sounds are normal, no distension. There is no tenderness. no masses MALE GENITALIA: Normal descended testes bilaterally, no masses palpated, no hernias,  no lesions, no discharge RECTAL: slightly enlarged prostate  Musculoskeletal: Normal range of motion, no joint effusions. No gross deformities Neurological: he is alert and oriented to person, place, and time. No cranial nerve deficit. Coordination, balance, strength, speech and gait are normal.  Skin: lesion on left anterior neck, per patient seems new and is large, brown  Psychiatric: Patient has a normal mood and affect. behavior is normal. Judgment  and thought content normal.  Recent Results (from the past 2160 hour(s))  CBC with Differential/Platelet     Status: Abnormal   Collection Time: 06/10/19 12:05 PM  Result Value Ref Range   WBC 8.5 3.8 - 10.8 Thousand/uL   RBC 4.62 4.20 - 5.80 Million/uL   Hemoglobin 12.5 (L) 13.2 - 17.1 g/dL   HCT 38.3 (L) 38.5 - 50.0 %   MCV 82.9 80.0 - 100.0 fL   MCH 27.1 27.0 - 33.0 pg   MCHC 32.6 32.0 - 36.0 g/dL   RDW 14.8 11.0 - 15.0 %   Platelets 314 140 - 400 Thousand/uL   MPV 10.0 7.5 - 12.5 fL   Neutro Abs 5,950 1,500 - 7,800 cells/uL   Lymphs Abs 1,845 850 - 3,900 cells/uL   Absolute Monocytes 604 200 - 950 cells/uL   Eosinophils Absolute 60 15 - 500 cells/uL   Basophils Absolute 43 0 - 200 cells/uL   Neutrophils Relative % 70 %   Total Lymphocyte 21.7 %   Monocytes Relative 7.1 %   Eosinophils Relative 0.7 %   Basophils Relative 0.5 %  COMPLETE METABOLIC PANEL WITH GFR     Status: None   Collection Time: 06/10/19 12:05 PM  Result Value Ref Range   Glucose, Bld 96 65 - 99 mg/dL    Comment: .            Fasting reference interval .    BUN 24 7 - 25 mg/dL   Creat 0.77 0.70 - 1.33 mg/dL    Comment: For patients >59 years of age, the reference limit for Creatinine is approximately 13% higher for people identified as African-American. .    GFR, Est Non African American 99 > OR = 60 mL/min/1.82m2   GFR, Est African American 115 > OR = 60 mL/min/1.59m2   BUN/Creatinine Ratio NOT APPLICABLE 6 - 22 (calc)   Sodium 141 135 - 146 mmol/L   Potassium 5.1 3.5 - 5.3 mmol/L   Chloride 106 98 - 110 mmol/L   CO2 26 20 - 32 mmol/L   Calcium 9.6 8.6 - 10.3 mg/dL   Total Protein 7.1 6.1 - 8.1 g/dL   Albumin 4.4 3.6 - 5.1 g/dL   Globulin 2.7 1.9 - 3.7 g/dL (calc)   AG Ratio 1.6 1.0 - 2.5 (calc)   Total Bilirubin 0.6 0.2 - 1.2 mg/dL   Alkaline phosphatase (APISO) 71 35 - 144 U/L   AST 20 10 - 35 U/L   ALT 25 9 - 46 U/L  Hemoglobin A1c     Status: Abnormal   Collection Time: 06/10/19  12:05 PM  Result Value Ref Range   Hgb A1c MFr Bld 6.0 (H) <5.7 % of total Hgb    Comment: For someone without known diabetes, a hemoglobin  A1c value between 5.7% and 6.4% is consistent with prediabetes and should be confirmed with a  follow-up test. . For someone with known diabetes, a value <7% indicates that their diabetes is well controlled. A1c targets should be individualized based on duration of diabetes, age, comorbid conditions,  and other considerations. . This assay result is consistent with an increased risk of diabetes. . Currently, no consensus exists regarding use of hemoglobin A1c for diagnosis of diabetes for children. .    Mean Plasma Glucose 126 (calc)   eAG (mmol/L) 7.0 (calc)     PHQ2/9: Depression screen [facility] 2/9 06/15/2019 06/10/2019 12/15/2018 10/17/2017 10/11/2016  Decreased Interest 0 0 0 0 0  Down, Depressed, Hopeless 0 1 0 0 0  PHQ - 2 Score 0 1 0 0 0  Altered sleeping 1 1 0 0 -  Tired, decreased energy 1 0 0 1 -  Change in appetite 1 0 0 1 -  Feeling bad or failure about yourself  0 0 0 0 -  Trouble concentrating 0 1 0 0 -  Moving slowly or fidgety/restless 0 0 0 0 -  Suicidal thoughts 0 0 0 0 -  PHQ-9 Score 3 3 0 2 -  Difficult doing work/chores Not difficult at all Not difficult at all - Not difficult at all -     Fall Risk: Fall Risk  06/15/2019 06/10/2019 12/15/2018 10/17/2017 10/17/2017  Falls in the past year? 1 0 0 No No  Number falls in past yr: 0 0 0 - -  Injury with Fall? 0 0 0 - -     Functional Status Survey: Is the patient deaf or have difficulty hearing?: No Does the patient have difficulty seeing, even when wearing glasses/contacts?: No Does the patient have difficulty concentrating, remembering, or making decisions?: No Does the patient have difficulty walking or climbing stairs?: No Does the patient have difficulty dressing or bathing?: No Does the patient have difficulty doing errands alone such as visiting a doctor's office or  shopping?: No    Assessment & Plan  1. Well adult exam   2. Anemia, unspecified type  - Iron, TIBC and Ferritin Panel  4. Need for Tdap vaccination  - Tdap vaccine greater than or equal to 7yo IM  5. Skin lesion of neck  He will return for biopsy   -Prostate cancer screening and PSA options (with potential risks and benefits of testing vs not testing) were discussed along with recent recs/guidelines. -USPSTF grade A and B recommendations reviewed with patient; age-appropriate recommendations, preventive care, screening tests, etc discussed and encouraged; healthy living encouraged; see AVS for patient education given to patient -Discussed importance of 150 minutes of physical activity weekly, eat two servings of fish weekly, eat one serving of tree nuts ( cashews, pistachios, pecans, almonds.Marland Kitchen) every other day, eat 6 servings of fruit/vegetables daily and drink plenty of water and avoid sweet beverages.

## 2019-06-16 ENCOUNTER — Other Ambulatory Visit: Payer: Self-pay | Admitting: Family Medicine

## 2019-06-16 DIAGNOSIS — I10 Essential (primary) hypertension: Secondary | ICD-10-CM

## 2019-06-16 LAB — COMPLETE METABOLIC PANEL WITH GFR
AG Ratio: 1.6 (calc) (ref 1.0–2.5)
ALT: 25 U/L (ref 9–46)
AST: 20 U/L (ref 10–35)
Albumin: 4.4 g/dL (ref 3.6–5.1)
Alkaline phosphatase (APISO): 71 U/L (ref 35–144)
BUN: 24 mg/dL (ref 7–25)
CO2: 26 mmol/L (ref 20–32)
Calcium: 9.6 mg/dL (ref 8.6–10.3)
Chloride: 106 mmol/L (ref 98–110)
Creat: 0.77 mg/dL (ref 0.70–1.33)
GFR, Est African American: 115 mL/min/{1.73_m2} (ref >=60)
GFR, Est Non African American: 99 mL/min/{1.73_m2} (ref >=60)
Globulin: 2.7 g/dL (calc) (ref 1.9–3.7)
Glucose, Bld: 96 mg/dL (ref 65–99)
Potassium: 5.1 mmol/L (ref 3.5–5.3)
Sodium: 141 mmol/L (ref 135–146)
Total Bilirubin: 0.6 mg/dL (ref 0.2–1.2)
Total Protein: 7.1 g/dL (ref 6.1–8.1)

## 2019-06-16 LAB — CBC WITH DIFFERENTIAL/PLATELET
Absolute Monocytes: 604 cells/uL (ref 200–950)
Basophils Absolute: 43 cells/uL (ref 0–200)
Basophils Relative: 0.5 %
Eosinophils Absolute: 60 cells/uL (ref 15–500)
Eosinophils Relative: 0.7 %
HCT: 38.3 % — ABNORMAL LOW (ref 38.5–50.0)
Hemoglobin: 12.5 g/dL — ABNORMAL LOW (ref 13.2–17.1)
Lymphs Abs: 1845 cells/uL (ref 850–3900)
MCH: 27.1 pg (ref 27.0–33.0)
MCHC: 32.6 g/dL (ref 32.0–36.0)
MCV: 82.9 fL (ref 80.0–100.0)
MPV: 10 fL (ref 7.5–12.5)
Monocytes Relative: 7.1 %
Neutro Abs: 5950 cells/uL (ref 1500–7800)
Neutrophils Relative %: 70 %
Platelets: 314 10*3/uL (ref 140–400)
RBC: 4.62 10*6/uL (ref 4.20–5.80)
RDW: 14.8 % (ref 11.0–15.0)
Total Lymphocyte: 21.7 %
WBC: 8.5 10*3/uL (ref 3.8–10.8)

## 2019-06-16 LAB — IRON,TIBC AND FERRITIN PANEL
%SAT: 18 % (calc) — ABNORMAL LOW (ref 20–48)
Ferritin: 75 ng/mL (ref 38–380)
Iron: 57 ug/dL (ref 50–180)
TIBC: 319 mcg/dL (calc) (ref 250–425)

## 2019-06-16 LAB — HEMOGLOBIN A1C
Hgb A1c MFr Bld: 6 % of total Hgb — ABNORMAL HIGH (ref <5.7)
Mean Plasma Glucose: 126 (calc)
eAG (mmol/L): 7 (calc)

## 2019-06-16 LAB — TEST AUTHORIZATION

## 2019-06-16 MED ORDER — LISINOPRIL 10 MG PO TABS: 10.0000 mg | ORAL_TABLET | Freq: Every day | ORAL | 1 refills | Status: DC

## 2019-07-10 ENCOUNTER — Other Ambulatory Visit (HOSPITAL_COMMUNITY)
Admission: RE | Admit: 2019-07-10 | Discharge: 2019-07-10 | Disposition: A | Discharge status: Home / Self Care | Payer: BC Managed Care – PPO | Source: Ambulatory Visit | Attending: Family Medicine | Admitting: Family Medicine

## 2019-07-10 ENCOUNTER — Encounter: Payer: Self-pay | Admitting: Family Medicine

## 2019-07-10 ENCOUNTER — Other Ambulatory Visit: Payer: Self-pay

## 2019-07-10 ENCOUNTER — Ambulatory Visit: Payer: BC Managed Care – PPO | Admitting: Family Medicine

## 2019-07-10 VITALS — BP 112/70 | HR 109 | Temp 97.3°F | Resp 18 | Ht 67.0 in | Wt 232.6 lb

## 2019-07-10 DIAGNOSIS — D224 Melanocytic nevi of scalp and neck: Secondary | ICD-10-CM

## 2019-07-10 DIAGNOSIS — L821 Other seborrheic keratosis: Secondary | ICD-10-CM | POA: Diagnosis not present

## 2019-07-10 MED ORDER — LIDOCAINE HCL (PF) 1 % IJ SOLN
2.0000 mL | Freq: Once | INTRAMUSCULAR | Status: AC
  Administered 2019-07-10: 2 mL

## 2019-07-10 NOTE — Progress Notes (Signed)
Name: Melvin Williams   MRN: [id number]    DOB: 1960/10/09   Date:07/10/2019       Progress Note  Subjective  Chief Complaint  Chief Complaint  Patient presents with  . Skin Biopsy    HPI  Skin lesions: neck lesion, present for a long time but starting bother him since wearing a mask   Patient Active Problem List   Diagnosis Date Noted  . Cellulitis of both lower extremities 06/10/2019  . Morbid obesity, unspecified obesity type ([place]) 10/17/2017  . Benign prostatic hyperplasia without lower urinary tract symptoms 04/11/2016  . PSA elevation 04/11/2016  . Pre-diabetes 04/11/2016  . Hypertension, benign 10/10/2015  . Hyperlipidemia 10/10/2015  . Hyperglycemia 10/10/2015  . Vitamin D deficiency 10/10/2015  . Microalbuminuria 10/10/2015  . Overweight (BMI 25.0-29.9) 10/10/2015  . Eczema 10/10/2015  . Seborrhea 10/10/2015  . Fatty liver 10/10/2015  . Cholelithiasis 10/10/2015  . Cervical disc disease 10/10/2015    Social History   Tobacco Use  . Smoking status: Never Smoker  . Smokeless tobacco: Never Used  Substance Use Topics  . Alcohol use: No     Current Outpatient Medications:  .  aspirin EC 81 MG tablet, Take by mouth., Disp: , Rfl:  .  cholecalciferol (VITAMIN D) 1000 units tablet, Take 1,000 Units by mouth daily., Disp: , Rfl:  .  ibuprofen (ADVIL,MOTRIN) 200 MG tablet, Take 200 mg by mouth as needed., Disp: , Rfl:  .  lisinopril (ZESTRIL) 10 MG tablet, Take 1 tablet (10 mg total) by mouth daily., Disp: 90 tablet, Rfl: 1 .  tamsulosin (FLOMAX) 0.4 MG CAPS capsule, Take 1 capsule (0.4 mg total) by mouth daily., Disp: 90 capsule, Rfl: 0  No Known Allergies  ROS  Ten systems reviewed and is negative except as mentioned in HPI   Objective  Vitals:   07/10/19 1159  BP: 112/70  Pulse: (!) 109  Resp: 18  Temp: (!) 97.3 F (36.3 C)  TempSrc: Temporal  SpO2: 99%  Weight: 232 lb 9.6 oz (105.5 kg)  Height: 5\' 7"  (1.702 m)    Body mass index is 36.43  kg/m.    Physical Exam  Skin:less than 5 mm lesion on anterior neck   Recent Results (from the past 2160 hour(s))  CBC with Differential/Platelet     Status: Abnormal   Collection Time: 06/10/19 12:05 PM  Result Value Ref Range   WBC 8.5 3.8 - 10.8 Thousand/uL   RBC 4.62 4.20 - 5.80 Million/uL   Hemoglobin 12.5 (L) 13.2 - 17.1 g/dL   HCT 38.3 (L) 38.5 - 50.0 %   MCV 82.9 80.0 - 100.0 fL   MCH 27.1 27.0 - 33.0 pg   MCHC 32.6 32.0 - 36.0 g/dL   RDW 14.8 11.0 - 15.0 %   Platelets 314 140 - 400 Thousand/uL   MPV 10.0 7.5 - 12.5 fL   Neutro Abs 5,950 1,500 - 7,800 cells/uL   Lymphs Abs 1,845 850 - 3,900 cells/uL   Absolute Monocytes 604 200 - 950 cells/uL   Eosinophils Absolute 60 15 - 500 cells/uL   Basophils Absolute 43 0 - 200 cells/uL   Neutrophils Relative % 70 %   Total Lymphocyte 21.7 %   Monocytes Relative 7.1 %   Eosinophils Relative 0.7 %   Basophils Relative 0.5 %  COMPLETE METABOLIC PANEL WITH GFR     Status: None   Collection Time: 06/10/19 12:05 PM  Result Value Ref Range   Glucose, Bld 96 65 -  99 mg/dL    Comment: .            Fasting reference interval .    BUN 24 7 - 25 mg/dL   Creat 0.77 0.70 - 1.33 mg/dL    Comment: For patients >105 years of age, the reference limit for Creatinine is approximately 13% higher for people identified as African-American. .    GFR, Est Non African American 99 > OR = 60 mL/min/1.60m2   GFR, Est African American 115 > OR = 60 mL/min/1.68m2   BUN/Creatinine Ratio NOT APPLICABLE 6 - 22 (calc)   Sodium 141 135 - 146 mmol/L   Potassium 5.1 3.5 - 5.3 mmol/L   Chloride 106 98 - 110 mmol/L   CO2 26 20 - 32 mmol/L   Calcium 9.6 8.6 - 10.3 mg/dL   Total Protein 7.1 6.1 - 8.1 g/dL   Albumin 4.4 3.6 - 5.1 g/dL   Globulin 2.7 1.9 - 3.7 g/dL (calc)   AG Ratio 1.6 1.0 - 2.5 (calc)   Total Bilirubin 0.6 0.2 - 1.2 mg/dL   Alkaline phosphatase (APISO) 71 35 - 144 U/L   AST 20 10 - 35 U/L   ALT 25 9 - 46 U/L  Hemoglobin A1c      Status: Abnormal   Collection Time: 06/10/19 12:05 PM  Result Value Ref Range   Hgb A1c MFr Bld 6.0 (H) <5.7 % of total Hgb    Comment: For someone without known diabetes, a hemoglobin  A1c value between 5.7% and 6.4% is consistent with prediabetes and should be confirmed with a  follow-up test. . For someone with known diabetes, a value <7% indicates that their diabetes is well controlled. A1c targets should be individualized based on duration of diabetes, age, comorbid conditions, and other considerations. . This assay result is consistent with an increased risk of diabetes. . Currently, no consensus exists regarding use of hemoglobin A1c for diagnosis of diabetes for children. .    Mean Plasma Glucose 126 (calc)   eAG (mmol/L) 7.0 (calc)  Iron, TIBC and Ferritin Panel     Status: Abnormal   Collection Time: 06/10/19 12:05 PM  Result Value Ref Range   Iron 57 50 - 180 mcg/dL   TIBC 319 250 - 425 mcg/dL (calc)   %SAT 18 (L) 20 - 48 % (calc)   Ferritin 75 38 - 380 ng/mL  TEST AUTHORIZATION     Status: None   Collection Time: 06/10/19 12:05 PM  Result Value Ref Range   TEST NAME: IRON, TIBC AND FERRITIN PANEL    TEST CODE: [id number]    CLIENT CONTACT: LESLIE SMITH    REPORT ALWAYS MESSAGE SIGNATURE      Comment: . The laboratory testing on this patient was verbally requested or confirmed by the ordering physician or his or her authorized representative after contact with an employee of [employer]. Federal regulations require that we maintain on file written authorization for all laboratory testing.  Accordingly we are asking that the ordering physician or his or her authorized representative sign a copy of this report and promptly return it to the client service representative. . . Signature:____________________________________________________ . Please fax this signed page to [phone] or return it via your [employer] courier.      Assessment &  Plan   1. Atypical mole of neck  Consent signed: YES  Procedure: atypical lesion  Location: right anterior neck  Equipment used: derma-blade, high temperature cautery, sterile scalpel, tweezers, curved scissors  Anesthesia: 1% Lidocaine w/o Epinephrine  Cleaned and prepped: Betadine  After consent signed, are of skin prepped with betadine. Lidocaine w/o epinephrine injected into skin underneath skin mass. After properly numbed sterile equipment used to remove tag.  Specimen sent for pathology analysis. Instructed on proper care to allow for proper healing. F/U for nursing visit if needed.  - lidocaine (PF) (XYLOCAINE) 1 % injection 2 mL - Surgical pathology

## 2019-07-14 LAB — SURGICAL PATHOLOGY

## 2019-08-04 ENCOUNTER — Ambulatory Visit: Payer: BC Managed Care – PPO | Admitting: Urology

## 2019-08-04 ENCOUNTER — Other Ambulatory Visit: Payer: Self-pay | Admitting: Family Medicine

## 2019-08-04 DIAGNOSIS — N401 Enlarged prostate with lower urinary tract symptoms: Secondary | ICD-10-CM

## 2019-08-04 NOTE — Telephone Encounter (Signed)
Requested medication (s) are due for refill today: yes  Requested medication (s) are on the active medication list: yes  Last refill: 04/25/19  Future visit scheduled: yes   Notes to clinic:  insurance requesting alternate medication    Requested Prescriptions  Pending Prescriptions Disp Refills   tamsulosin (FLOMAX) 0.4 MG CAPS capsule [Pharmacy Med Name: TAMSULOSIN 0.4MG  CAPSULES] 90 capsule 0    Sig: TAKE 1 CAPSULE(0.4 MG) BY MOUTH DAILY      Urology: Alpha-Adrenergic Blocker Passed - 08/04/2019 11:25 AM      Passed - Last BP in normal range    BP Readings from Last 1 Encounters:  07/10/19 112/70          Passed - Valid encounter within last 12 months    Recent Outpatient Visits           3 weeks ago Atypical mole of neck   [facility] Steele Sizer, MD   1 month ago Well adult exam   [facility] Steele Sizer, MD   1 month ago Cellulitis of both lower extremities   [facility] Lebron Conners D, MD   7 months ago Need for immunization against influenza   [facility] Steele Sizer, MD   1 year ago Well adult exam   [facility] Steele Sizer, MD       Future Appointments             In 4 months Steele Sizer, MD [facility], [facility]

## 2019-08-11 ENCOUNTER — Ambulatory Visit: Payer: BC Managed Care – PPO | Admitting: Urology

## 2019-08-13 ENCOUNTER — Telehealth: Payer: Self-pay | Admitting: Urology

## 2019-08-13 NOTE — Telephone Encounter (Signed)
Would you call Mr. Elissa Hefty and have him schedule an appointment for KUB and to recheck on his UA for his history of kidney stones?

## 2019-08-21 NOTE — Telephone Encounter (Signed)
Patient does not want to have KUB or appointment due to cost.

## 2019-08-24 ENCOUNTER — Telehealth: Payer: Self-pay | Admitting: Urology

## 2019-08-24 NOTE — Telephone Encounter (Signed)
I certainly understand not wanting to have further medical bills.   We can have him follow up in October/November as his PSA has been on an upward trend and we can get a KUB and check his UA at that time.

## 2019-08-24 NOTE — Telephone Encounter (Signed)
Patient would not like to make an appointment. States his PCP checks his PSA.

## 2019-11-03 ENCOUNTER — Other Ambulatory Visit: Payer: Self-pay | Admitting: Family Medicine

## 2019-11-03 DIAGNOSIS — N401 Enlarged prostate with lower urinary tract symptoms: Secondary | ICD-10-CM

## 2019-11-03 NOTE — Telephone Encounter (Signed)
Requested medications are due for refill today?  Yes  Requested medications are on active medication list?  Yes  Last Refill:  08/04/2019  # 90 with no refills  Future visit scheduled? Yes in one month  Notes to Clinic:    Please see the message below.    Why Am I Seeing These Alternatives?   tamsulosin (FLOMAX) 0.4 MG CAPS capsule [Pharmacy Med Name: TAMSULOSIN 0.4MG  CAPSULES] is not on the preferred formulary for the patient's insurance plan. Below are alternatives which are likely to be more affordable. Do not assume that every medication presented is a clinically appropriate alternative.  These alternatives are medications that are in the same pharmaceutical subclass (Prostatic Hypertrophy Agents) as the ordered medication and are on formulary for the patient's insurance plan.

## 2019-12-03 ENCOUNTER — Other Ambulatory Visit: Payer: Self-pay | Admitting: Family Medicine

## 2019-12-03 DIAGNOSIS — I10 Essential (primary) hypertension: Secondary | ICD-10-CM

## 2019-12-14 NOTE — Progress Notes (Signed)
Name: Melvin Williams   MRN: [id number]    DOB: 1960-09-19   Date:12/16/2019       Progress Note  Subjective  Chief Complaint  Chief Complaint  Patient presents with  . Follow-up    HPI   HTN: he had lost weight on his last visit and was off lisinopril for a while, however he is gaining weight again, up 9 lbs since March, he is on lisinopril 10 mg daily and denies side effects. He has not been checking bp at home, bp today is slightly elevated. No chest pain or palpitation History of microalbuminuria.   BPH: he feels like medication has helped with symptoms and wants to continue flomax, due for repeat PSA today   Hyperlipidemia:he has not been taking medications, we will recheck labs today and decide if he needs to resume medication, reviewed a healthy diet with patient   Obesity BMI above 35 with pre-diabetes and HTN, dyslipidemia. He states he is stress eater, he has   gained weight but states stable around 240 lbs now. He has been under stress at work , discussed  packing healthier snacks.   Patient Active Problem List   Diagnosis Date Noted  . Cellulitis of both lower extremities 06/10/2019  . Morbid obesity, unspecified obesity type ([place]) 10/17/2017  . Benign prostatic hyperplasia without lower urinary tract symptoms 04/11/2016  . PSA elevation 04/11/2016  . Pre-diabetes 04/11/2016  . Hypertension, benign 10/10/2015  . Hyperlipidemia 10/10/2015  . Hyperglycemia 10/10/2015  . Vitamin D deficiency 10/10/2015  . Microalbuminuria 10/10/2015  . Overweight (BMI 25.0-29.9) 10/10/2015  . Eczema 10/10/2015  . Seborrhea 10/10/2015  . Fatty liver 10/10/2015  . Cholelithiasis 10/10/2015  . Cervical disc disease 10/10/2015    No past surgical history on file.  Family History  Problem Relation Age of Onset  . Breast cancer Mother   . Liver cancer Mother   . Heart disease Father   . Heart attack Father   . Heart attack Paternal Grandfather     Social History   Tobacco Use   . Smoking status: Never Smoker  . Smokeless tobacco: Never Used  Substance Use Topics  . Alcohol use: No     Current Outpatient Medications:  .  aspirin EC 81 MG tablet, Take by mouth., Disp: , Rfl:  .  cholecalciferol (VITAMIN D) 1000 units tablet, Take 1,000 Units by mouth daily., Disp: , Rfl:  .  ibuprofen (ADVIL,MOTRIN) 200 MG tablet, Take 200 mg by mouth as needed., Disp: , Rfl:  .  lisinopril (ZESTRIL) 10 MG tablet, TAKE 1 TABLET(10 MG) BY MOUTH DAILY, Disp: 90 tablet, Rfl: 0 .  lisinopril (ZESTRIL) 20 MG tablet, Take 20 mg by mouth daily., Disp: , Rfl:  .  tamsulosin (FLOMAX) 0.4 MG CAPS capsule, TAKE 1 CAPSULE(0.4 MG) BY MOUTH DAILY, Disp: 90 capsule, Rfl: 0  No Known Allergies  I personally reviewed active problem list, medication list, allergies, family history, social history, health maintenance with the patient/caregiver today.   ROS  Constitutional: Negative for fever, positive for  weight change.  Respiratory: Negative for cough and shortness of breath.   Cardiovascular: Negative for chest pain or palpitations.  Gastrointestinal: Negative for abdominal pain, no bowel changes.  Musculoskeletal: Negative for gait problem or joint swelling.  Skin: rash on left lower leg  Neurological: Negative for dizziness or headache.  No other specific complaints in a complete review of systems (except as listed in HPI above).  Objective  Vitals:   12/16/19  0740  BP: 132/78  Pulse: 72  Temp: 98.5 F (36.9 C)  SpO2: 98%  Weight: 245 lb 11.2 oz (111.4 kg)  Height: 5\' 7"  (1.702 m)    Body mass index is 38.48 kg/m.  Physical Exam  Constitutional: Patient appears well-developed and well-nourished. Obese No distress.  HEENT: head atraumatic, normocephalic, pupils equal and reactive to light,  neck supple Cardiovascular: Normal rate, regular rhythm and normal heart sounds.  No murmur heard. Lower extremity edema - trace  Pulmonary/Chest: Effort normal and breath sounds  normal. No respiratory distress. Abdominal: Soft.  There is no tenderness. Skin: erythematous rash on left inner lower leg, with a small port of entry, mildly tender to touch , he states improving  Psychiatric: Patient has a normal mood and affect. behavior is normal. Judgment and thought content normal.  PHQ2/9: Depression screen [facility] 2/9 12/16/2019 07/10/2019 06/15/2019 06/10/2019 12/15/2018  Decreased Interest 0 0 0 0 0  Down, Depressed, Hopeless 0 0 0 1 0  PHQ - 2 Score 0 0 0 1 0  Altered sleeping - 0 1 1 0  Tired, decreased energy - 0 1 0 0  Change in appetite - 0 1 0 0  Feeling bad or failure about yourself  - 0 0 0 0  Trouble concentrating - 0 0 1 0  Moving slowly or fidgety/restless - 0 0 0 0  Suicidal thoughts - 0 0 0 0  PHQ-9 Score - 0 3 3 0  Difficult doing work/chores - Not difficult at all Not difficult at all Not difficult at all -    phq 9 is negative   Fall Risk: Fall Risk  12/16/2019 07/10/2019 06/15/2019 06/10/2019 12/15/2018  Falls in the past year? 0 1 1 0 0  Number falls in past yr: 0 0 0 0 0  Injury with Fall? 0 0 0 0 0     Functional Status Survey: Is the patient deaf or have difficulty hearing?: No Does the patient have difficulty seeing, even when wearing glasses/contacts?: No Does the patient have difficulty concentrating, remembering, or making decisions?: Yes Does the patient have difficulty walking or climbing stairs?: No Does the patient have difficulty dressing or bathing?: No Does the patient have difficulty doing errands alone such as visiting a doctor's office or shopping?: No    Assessment & Plan  1. Need for immunization against influenza  - Flu Vaccine QUAD 36+ mos IM  2. Essential hypertension  - Microalbumin / creatinine urine ratio - CBC with Differential/Platelet - COMPLETE METABOLIC PANEL WITH GFR  3. Benign prostatic hyperplasia with lower urinary tract symptoms, symptom details unspecified  - PSA - tamsulosin (FLOMAX) 0.4 MG CAPS  capsule; Take 1 capsule (0.4 mg total) by mouth daily.  Dispense: 90 capsule; Refill: 1  4. Bilateral lower extremity edema  Discussed referral to vascular surgeon but he would like to hold off for now  5. Cellulitis of left lower extremity  He states keeps injuring his leg, trying to keep socks down, offered antibiotics but he states it is improving   6. Prediabetes  - Hemoglobin A1c  7. Vitamin D deficiency  Continue supplementation   8. Morbid obesity ([place])  Discussed with the patient the risk posed by an increased BMI. Discussed importance of portion control, calorie counting and at least 150 minutes of physical activity weekly. Avoid sweet beverages and drink more water. Eat at least 6 servings of fruit and vegetables daily   9. Hypertension, benign  - lisinopril (ZESTRIL)  10 MG tablet; Take 1 tablet (10 mg total) by mouth daily.  Dispense: 90 tablet; Refill: 1  10. Mixed hyperlipidemia  - Lipid panel

## 2019-12-16 ENCOUNTER — Other Ambulatory Visit: Payer: Self-pay

## 2019-12-16 ENCOUNTER — Encounter: Payer: Self-pay | Admitting: Family Medicine

## 2019-12-16 ENCOUNTER — Ambulatory Visit: Payer: BC Managed Care – PPO | Admitting: Family Medicine

## 2019-12-16 VITALS — BP 132/78 | HR 72 | Temp 98.5°F | Ht 67.0 in | Wt 245.7 lb

## 2019-12-16 DIAGNOSIS — E559 Vitamin D deficiency, unspecified: Secondary | ICD-10-CM

## 2019-12-16 DIAGNOSIS — I10 Essential (primary) hypertension: Secondary | ICD-10-CM | POA: Diagnosis not present

## 2019-12-16 DIAGNOSIS — R7303 Prediabetes: Secondary | ICD-10-CM | POA: Diagnosis not present

## 2019-12-16 DIAGNOSIS — N401 Enlarged prostate with lower urinary tract symptoms: Secondary | ICD-10-CM | POA: Diagnosis not present

## 2019-12-16 DIAGNOSIS — L03116 Cellulitis of left lower limb: Secondary | ICD-10-CM | POA: Diagnosis not present

## 2019-12-16 DIAGNOSIS — Z23 Encounter for immunization: Secondary | ICD-10-CM | POA: Diagnosis not present

## 2019-12-16 DIAGNOSIS — E782 Mixed hyperlipidemia: Secondary | ICD-10-CM | POA: Diagnosis not present

## 2019-12-16 DIAGNOSIS — R600 Localized edema: Secondary | ICD-10-CM | POA: Diagnosis not present

## 2019-12-16 MED ORDER — TAMSULOSIN HCL 0.4 MG PO CAPS: 0.4000 mg | ORAL_CAPSULE | Freq: Every day | ORAL | 1 refills | Status: DC

## 2019-12-16 MED ORDER — LISINOPRIL 10 MG PO TABS: 10.0000 mg | ORAL_TABLET | Freq: Every day | ORAL | 1 refills | Status: DC

## 2019-12-17 LAB — CBC WITH DIFFERENTIAL/PLATELET
Absolute Monocytes: 473 cells/uL (ref 200–950)
Basophils Absolute: 38 cells/uL (ref 0–200)
Basophils Relative: 0.5 %
Eosinophils Absolute: 83 cells/uL (ref 15–500)
Eosinophils Relative: 1.1 %
HCT: 40.4 % (ref 38.5–50.0)
Hemoglobin: 13.2 g/dL (ref 13.2–17.1)
Lymphs Abs: 2183 cells/uL (ref 850–3900)
MCH: 27.4 pg (ref 27.0–33.0)
MCHC: 32.7 g/dL (ref 32.0–36.0)
MCV: 84 fL (ref 80.0–100.0)
MPV: 10.1 fL (ref 7.5–12.5)
Monocytes Relative: 6.3 %
Neutro Abs: 4725 cells/uL (ref 1500–7800)
Neutrophils Relative %: 63 %
Platelets: 296 10*3/uL (ref 140–400)
RBC: 4.81 10*6/uL (ref 4.20–5.80)
RDW: 15.6 % — ABNORMAL HIGH (ref 11.0–15.0)
Total Lymphocyte: 29.1 %
WBC: 7.5 10*3/uL (ref 3.8–10.8)

## 2019-12-17 LAB — LIPID PANEL
Cholesterol: 185 mg/dL (ref <200)
HDL: 57 mg/dL (ref >=40)
LDL Cholesterol (Calc): 103 mg/dL (calc) — ABNORMAL HIGH
Non-HDL Cholesterol (Calc): 128 mg/dL (calc) (ref <130)
Total CHOL/HDL Ratio: 3.2 (calc) (ref <5.0)
Triglycerides: 151 mg/dL — ABNORMAL HIGH (ref <150)

## 2019-12-17 LAB — COMPLETE METABOLIC PANEL WITH GFR
AG Ratio: 1.6 (calc) (ref 1.0–2.5)
ALT: 37 U/L (ref 9–46)
AST: 23 U/L (ref 10–35)
Albumin: 4.4 g/dL (ref 3.6–5.1)
Alkaline phosphatase (APISO): 63 U/L (ref 35–144)
BUN: 18 mg/dL (ref 7–25)
CO2: 26 mmol/L (ref 20–32)
Calcium: 9.6 mg/dL (ref 8.6–10.3)
Chloride: 104 mmol/L (ref 98–110)
Creat: 0.92 mg/dL (ref 0.70–1.25)
GFR, Est African American: 104 mL/min/{1.73_m2} (ref >=60)
GFR, Est Non African American: 90 mL/min/{1.73_m2} (ref >=60)
Globulin: 2.8 g/dL (calc) (ref 1.9–3.7)
Glucose, Bld: 91 mg/dL (ref 65–99)
Potassium: 4.4 mmol/L (ref 3.5–5.3)
Sodium: 139 mmol/L (ref 135–146)
Total Bilirubin: 0.5 mg/dL (ref 0.2–1.2)
Total Protein: 7.2 g/dL (ref 6.1–8.1)

## 2019-12-17 LAB — MICROALBUMIN / CREATININE URINE RATIO
Creatinine, Urine: 149 mg/dL (ref 20–320)
Microalb Creat Ratio: 5 mcg/mg creat (ref <30)
Microalb, Ur: 0.8 mg/dL

## 2019-12-17 LAB — HEMOGLOBIN A1C
Hgb A1c MFr Bld: 6.2 % of total Hgb — ABNORMAL HIGH (ref <5.7)
Mean Plasma Glucose: 131 (calc)
eAG (mmol/L): 7.3 (calc)

## 2019-12-17 LAB — PSA: PSA: 3.43 ng/mL (ref <=4.0)

## 2020-03-02 ENCOUNTER — Other Ambulatory Visit: Payer: Self-pay | Admitting: Family Medicine

## 2020-03-02 DIAGNOSIS — I10 Essential (primary) hypertension: Secondary | ICD-10-CM

## 2020-05-16 IMAGING — CR DG ABDOMEN 1V
1 series · 2 of 2 positions shown · non-contrast
Comparison: 11/07/2006

CLINICAL DATA: Hematuria and burning with urination

EXAM:
ABDOMEN - 1 VIEW

[Series 1: dg abd 1 view · 0.14mm/px · 2 of 2 slices shown]
[im 1/2]
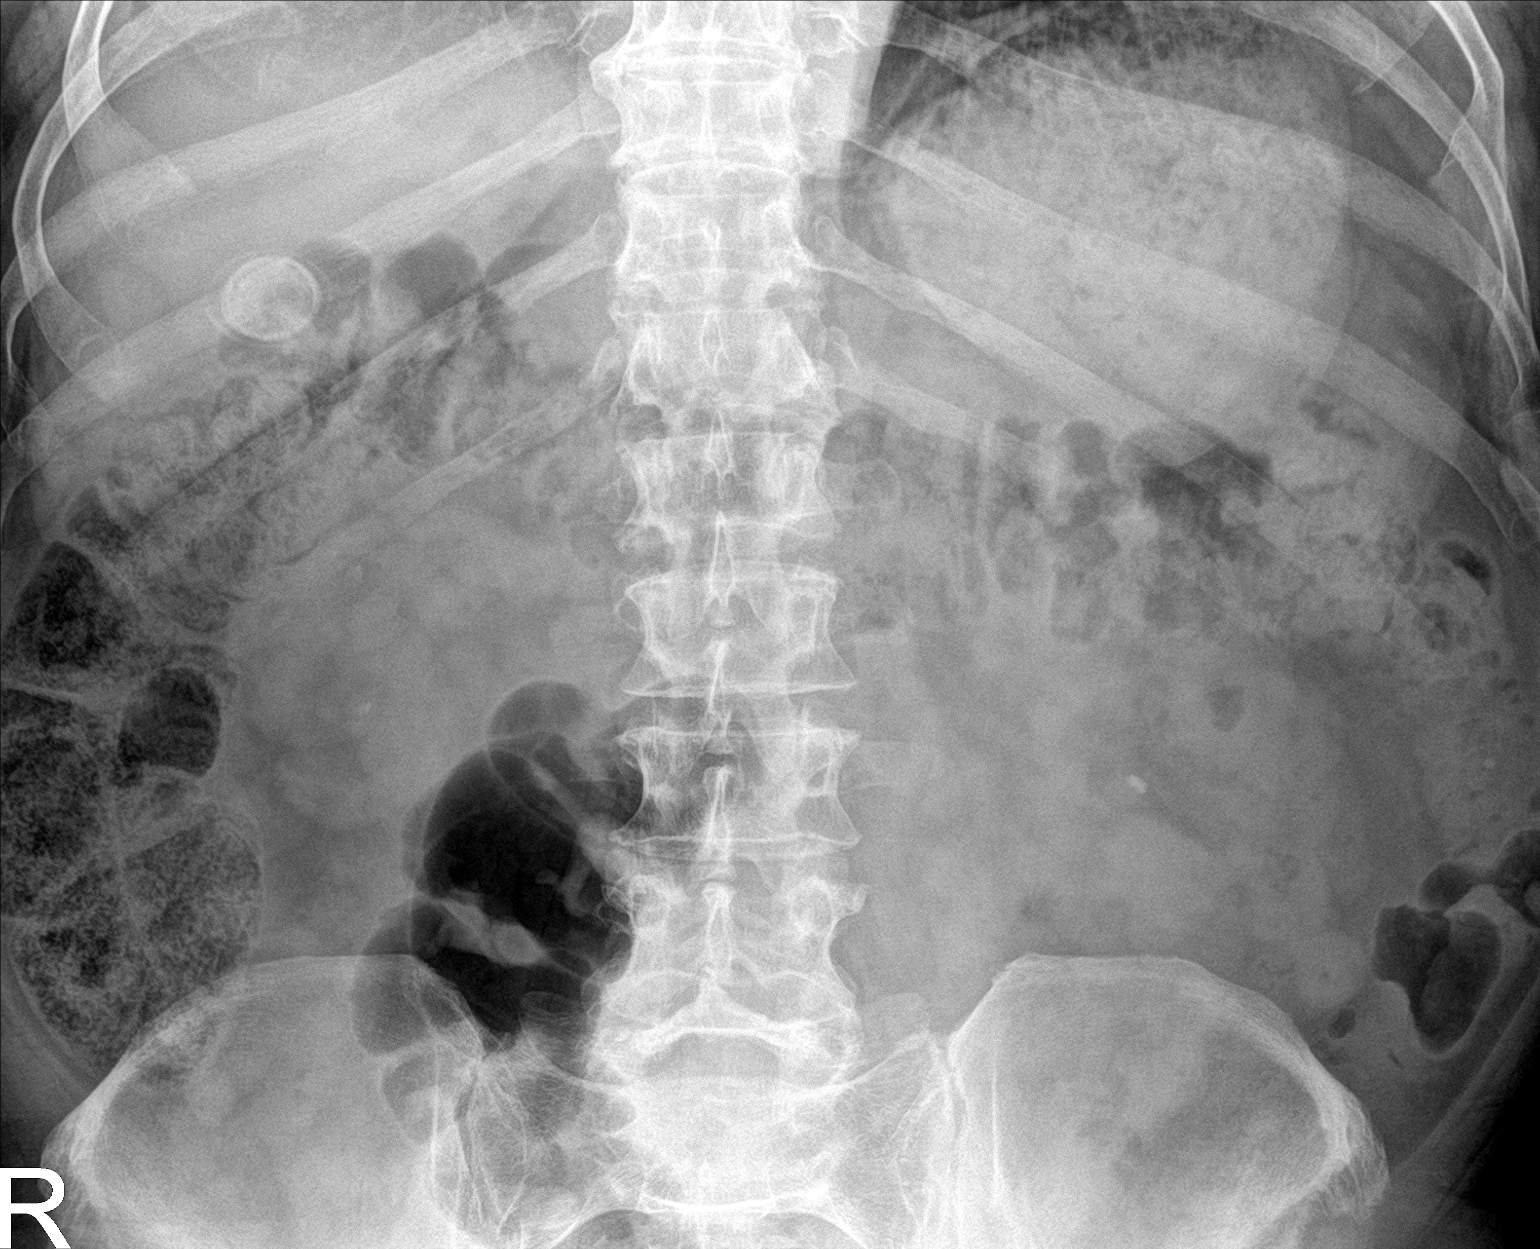
[im 2/2]
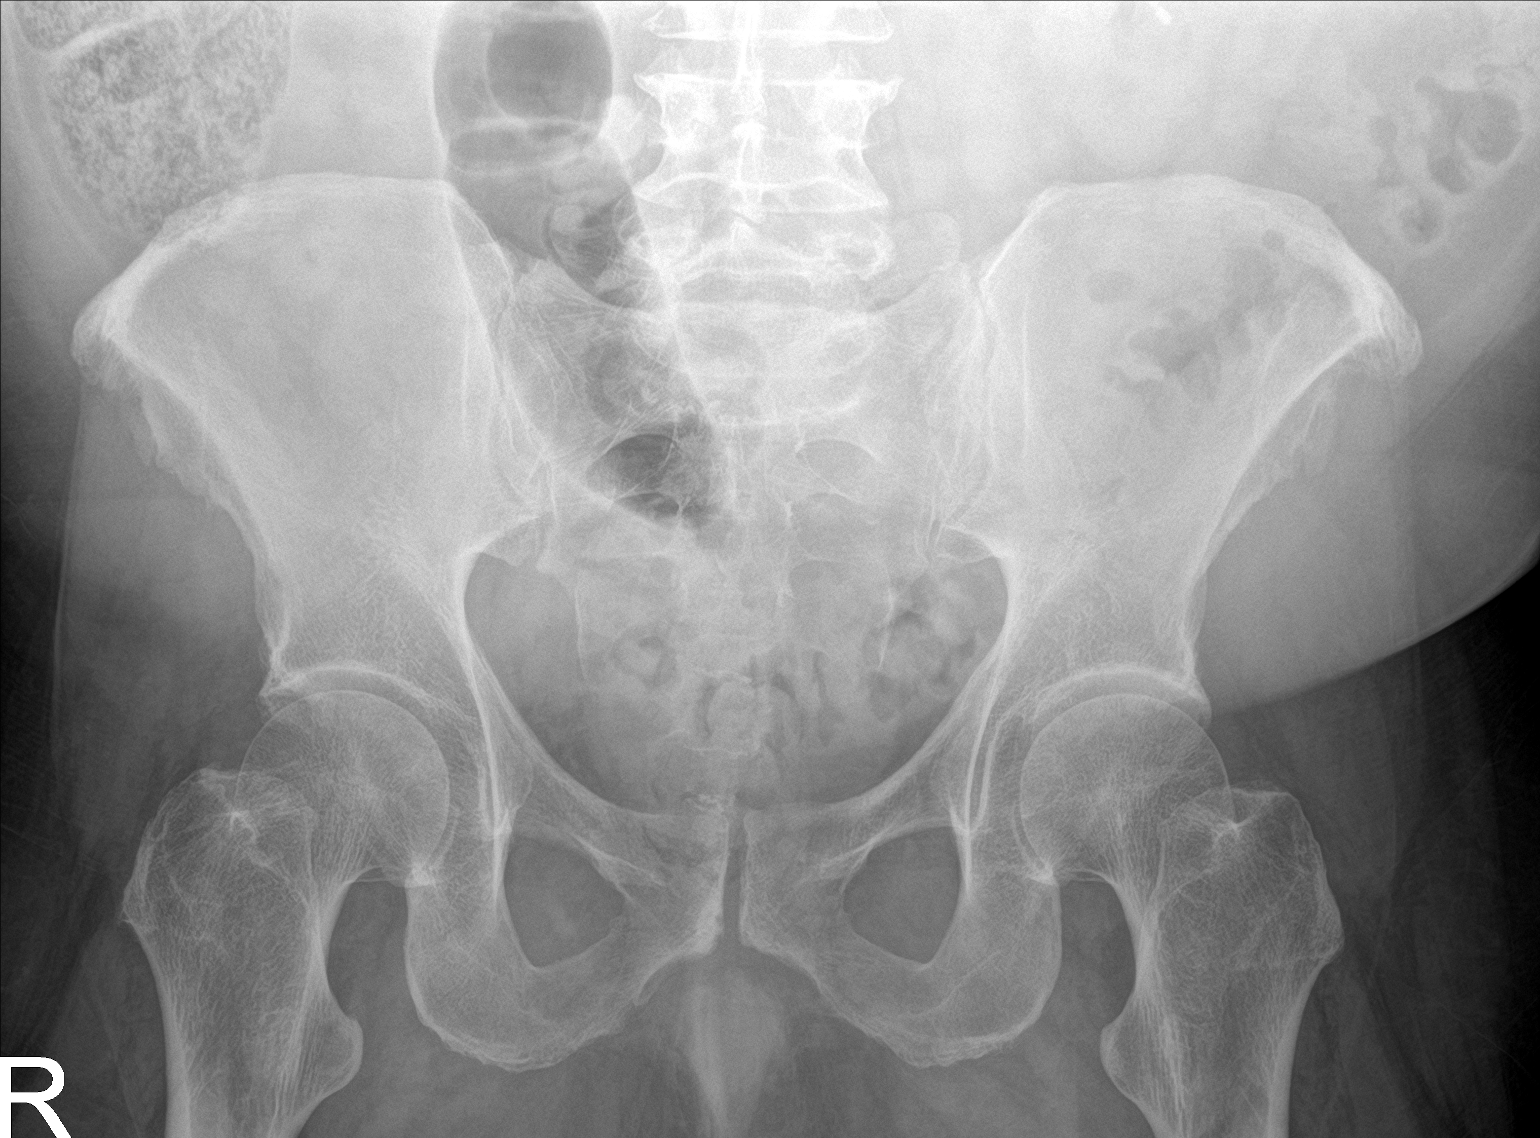

[2 of 2 positions shown; findings below may reference images not displayed]

FINDINGS: Scattered large and small bowel gas is noted. Mild retained fecal
material is seen. A lamellated gallstone is noted in the right upper
quadrant. Scattered bilateral renal calculi are noted which are
nonobstructive in nature. No definitive ureteral stones are seen. No
acute bony abnormality is noted. Degenerative changes of lumbar
spine are again seen.
IMPRESSION: Bilateral renal calculi.

Cholelithiasis.

Mild retained fecal material within the colon.

## 2020-05-23 ENCOUNTER — Ambulatory Visit: Payer: BC Managed Care – PPO | Admitting: Physician Assistant

## 2020-06-15 NOTE — Progress Notes (Signed)
Name: Melvin Williams   MRN: [id number]    DOB: 04-05-1960   Date:06/16/2020       Progress Note  Subjective  Chief Complaint  Follow up   HPI   HTN: he had lost weight on his last visit and was off lisinopril for a while, however he started to gain weight and BP went up again, but lost 6 lbs since last visit. BP is okay today,  He does not check it at work.  He denies chest pain or palpitation. He has a history of microalbuminuria.   BPH: he feels like medication has helped with symptoms and wants to continue flomax, last PSA was within normal limits, he has seen Urologist and given reassurance    History of kidney stones: no recent episodes of hematuria, he passed one stone in the past , seen Urologist end of 2020   Hyperlipidemia:he has not been taking medications, on life style modifications   The 10-year ASCVD risk score Mikey Bussing DC Brooke Bonito., et al., 2013) is: 8.8%   Values used to calculate the score:     Age: 60 years     Sex: Male     Is Non-Hispanic African American: No     Diabetic: No     Tobacco smoker: No     Systolic Blood Pressure: 086 mmHg     Is BP treated: Yes     HDL Cholesterol: 57 mg/dL     Total Cholesterol: 185 mg/dL  Obesity BMI above 35 with pre-diabetes and HTN, dyslipidemia. He states he is stress eater, he has   gained weight but states stable around 240 lbs, today weight is 239 lbs .Lost 6 lbs since last visit.   Recent episode of cellulitis on left lower leg: seen by Dr. Vickki Muff it was workmans' comp , he was given antibiotics and it has healed   Cholelithiasis: diagnosed many years ago, controlled with diet   Pre-diabetes: last A1C was 6.2 %, he denies polyphagia, polydipsia, he has urinary frequency but since he was in his 30's   Patient Active Problem List   Diagnosis Date Noted  . Cellulitis of both lower extremities 06/10/2019  . Morbid obesity, unspecified obesity type ([place]) 10/17/2017  . Benign prostatic hyperplasia without lower urinary tract  symptoms 04/11/2016  . PSA elevation 04/11/2016  . Pre-diabetes 04/11/2016  . Hypertension, benign 10/10/2015  . Hyperlipidemia 10/10/2015  . Hyperglycemia 10/10/2015  . Vitamin D deficiency 10/10/2015  . Microalbuminuria 10/10/2015  . Overweight (BMI 25.0-29.9) 10/10/2015  . Eczema 10/10/2015  . Seborrhea 10/10/2015  . Fatty liver 10/10/2015  . Cholelithiasis 10/10/2015  . Cervical disc disease 10/10/2015    No past surgical history on file.  Family History  Problem Relation Age of Onset  . Breast cancer Mother   . Liver cancer Mother   . Heart disease Father   . Heart attack Father   . Heart attack Paternal Grandfather     Social History   Tobacco Use  . Smoking status: Never Smoker  . Smokeless tobacco: Never Used  Substance Use Topics  . Alcohol use: No     Current Outpatient Medications:  .  aspirin EC 81 MG tablet, Take by mouth., Disp: , Rfl:  .  cholecalciferol (VITAMIN D) 1000 units tablet, Take 2,000 Units by mouth daily., Disp: , Rfl:  .  ibuprofen (ADVIL,MOTRIN) 200 MG tablet, Take 200 mg by mouth as needed., Disp: , Rfl:  .  lisinopril (ZESTRIL) 10 MG tablet, Take 1 tablet (  10 mg total) by mouth daily., Disp: 90 tablet, Rfl: 1 .  mupirocin ointment (BACTROBAN) 2 %, Apply topically., Disp: , Rfl:  .  tamsulosin (FLOMAX) 0.4 MG CAPS capsule, Take 1 capsule (0.4 mg total) by mouth daily., Disp: 90 capsule, Rfl: 1  No Known Allergies  I personally reviewed active problem list, medication list, allergies, family history, social history, health maintenance with the patient/caregiver today.   ROS  Constitutional: Negative for fever or weight change.  Respiratory: Negative for cough and shortness of breath.   Cardiovascular: Negative for chest pain or palpitations.  Gastrointestinal: Negative for abdominal pain, no bowel changes.  Musculoskeletal: Negative for gait problem or joint swelling.  Skin: Negative for rash.  Neurological: Negative for dizziness  or headache.  No other specific complaints in a complete review of systems (except as listed in HPI above).  Objective  Vitals:   06/16/20 0734  BP: 130/86  Pulse: 92  Resp: 16  Temp: 97.8 F (36.6 C)  TempSrc: Oral  SpO2: 96%  Weight: 239 lb (108.4 kg)  Height: 5\' 7"  (1.702 m)    Body mass index is 37.43 kg/m.  Physical Exam  Constitutional: Patient appears well-developed and well-nourished. Obese  No distress.  HEENT: head atraumatic, normocephalic, pupils equal and reactive to light,  neck supple Cardiovascular: Normal rate, regular rhythm and normal heart sounds.  No murmur heard. No BLE edema. Pulmonary/Chest: Effort normal and breath sounds normal. No respiratory distress. Abdominal: Soft.  There is no tenderness. Skin: erythematous area from recently healed skin excoriation and cellulitis on left lower leg  Psychiatric: Patient has a normal mood and affect. behavior is normal. Judgment and thought content normal.  PHQ2/9: Depression screen [facility] 2/9 06/16/2020 12/16/2019 07/10/2019 06/15/2019 06/10/2019  Decreased Interest 0 0 0 0 0  Down, Depressed, Hopeless 0 0 0 0 1  PHQ - 2 Score 0 0 0 0 1  Altered sleeping - - 0 1 1  Tired, decreased energy - - 0 1 0  Change in appetite - - 0 1 0  Feeling bad or failure about yourself  - - 0 0 0  Trouble concentrating - - 0 0 1  Moving slowly or fidgety/restless - - 0 0 0  Suicidal thoughts - - 0 0 0  PHQ-9 Score - - 0 3 3  Difficult doing work/chores - - Not difficult at all Not difficult at all Not difficult at all    phq 9 is negative   Fall Risk: Fall Risk  06/16/2020 12/16/2019 07/10/2019 06/15/2019 06/10/2019  Falls in the past year? 0 0 1 1 0  Number falls in past yr: 0 0 0 0 0  Injury with Fall? 0 0 0 0 0     Functional Status Survey: Is the patient deaf or have difficulty hearing?: No Does the patient have difficulty seeing, even when wearing glasses/contacts?: No Does the patient have difficulty concentrating,  remembering, or making decisions?: Yes Does the patient have difficulty walking or climbing stairs?: No Does the patient have difficulty dressing or bathing?: No Does the patient have difficulty doing errands alone such as visiting a doctor's office or shopping?: No    Assessment & Plan  1. Morbid obesity ([place])  Discussed with the patient the risk posed by an increased BMI. Discussed importance of portion control, calorie counting and at least 150 minutes of physical activity weekly. Avoid sweet beverages and drink more water. Eat at least 6 servings of fruit and vegetables daily  2. Benign prostatic hyperplasia with lower urinary tract symptoms, symptom details unspecified  - tamsulosin (FLOMAX) 0.4 MG CAPS capsule; Take 1 capsule (0.4 mg total) by mouth daily.  Dispense: 90 capsule; Refill: 1  3. Vitamin D deficiency  Continue supplementation   4. Hypertension, benign  - lisinopril (ZESTRIL) 10 MG tablet; Take 1 tablet (10 mg total) by mouth daily.  Dispense: 90 tablet; Refill: 1  5. Mixed hyperlipidemia   6. Prediabetes  Reviewed a healthy diet with patient   7. Need for shingles vaccine  - Varicella-zoster vaccine IM

## 2020-06-16 ENCOUNTER — Other Ambulatory Visit: Payer: Self-pay

## 2020-06-16 ENCOUNTER — Ambulatory Visit: Payer: BC Managed Care – PPO | Admitting: Family Medicine

## 2020-06-16 ENCOUNTER — Encounter: Payer: Self-pay | Admitting: Family Medicine

## 2020-06-16 DIAGNOSIS — R7303 Prediabetes: Secondary | ICD-10-CM

## 2020-06-16 DIAGNOSIS — E559 Vitamin D deficiency, unspecified: Secondary | ICD-10-CM

## 2020-06-16 DIAGNOSIS — N401 Enlarged prostate with lower urinary tract symptoms: Secondary | ICD-10-CM | POA: Diagnosis not present

## 2020-06-16 DIAGNOSIS — Z23 Encounter for immunization: Secondary | ICD-10-CM | POA: Diagnosis not present

## 2020-06-16 DIAGNOSIS — E782 Mixed hyperlipidemia: Secondary | ICD-10-CM

## 2020-06-16 DIAGNOSIS — I10 Essential (primary) hypertension: Secondary | ICD-10-CM

## 2020-06-16 MED ORDER — TAMSULOSIN HCL 0.4 MG PO CAPS: 0.4000 mg | ORAL_CAPSULE | Freq: Every day | ORAL | 1 refills | Status: DC

## 2020-06-16 MED ORDER — LISINOPRIL 10 MG PO TABS: 10.0000 mg | ORAL_TABLET | Freq: Every day | ORAL | 1 refills | Status: DC

## 2020-07-06 ENCOUNTER — Telehealth (INDEPENDENT_AMBULATORY_CARE_PROVIDER_SITE_OTHER): Payer: BC Managed Care – PPO | Admitting: Family Medicine

## 2020-07-06 ENCOUNTER — Telehealth: Payer: Self-pay

## 2020-07-06 ENCOUNTER — Encounter: Payer: Self-pay | Admitting: Family Medicine

## 2020-07-06 ENCOUNTER — Other Ambulatory Visit: Payer: Self-pay

## 2020-07-06 DIAGNOSIS — U071 COVID-19: Secondary | ICD-10-CM

## 2020-07-06 DIAGNOSIS — E782 Mixed hyperlipidemia: Secondary | ICD-10-CM | POA: Diagnosis not present

## 2020-07-06 DIAGNOSIS — J069 Acute upper respiratory infection, unspecified: Secondary | ICD-10-CM

## 2020-07-06 DIAGNOSIS — I10 Essential (primary) hypertension: Secondary | ICD-10-CM | POA: Diagnosis not present

## 2020-07-06 NOTE — Progress Notes (Signed)
Name: Melvin Williams   MRN: [id number]    DOB: 02-16-1961   Date:07/06/2020       Progress Note  Subjective:    Chief Complaint  Chief Complaint  Patient presents with  . Covid Positive    Symptoms started 4/23. Cough, fever, congested, runny nose, sore throat. Did at home test and lab testing    I connected with  Verneita Griffes  on 07/06/20 at  1:20 PM EDT by a video enabled telemedicine application and verified that I am speaking with the correct person using two identifiers.  I discussed the limitations of evaluation and management by telemedicine and the availability of in person appointments. The patient expressed understanding and agreed to proceed. Staff also discussed with the patient that there may be a patient responsible charge related to this service. Patient Location:home Provider Location: [facility] clinic Additional Individuals present: none  HPI COVID +  Having allergy sx  Sunday onset of fever, positive COVID home test and [facility] test  No fever for the past 2 days  Still having nasal sx Burning sensation in nose and sinus congestion and sore throat, coughing - dry No fatigue, h/c chills, night sweats, SOB CP feeling better overall     Patient Active Problem List   Diagnosis Date Noted  . Cellulitis of both lower extremities 06/10/2019  . Morbid obesity, unspecified obesity type ([place]) 10/17/2017  . Benign prostatic hyperplasia without lower urinary tract symptoms 04/11/2016  . PSA elevation 04/11/2016  . Pre-diabetes 04/11/2016  . Hypertension, benign 10/10/2015  . Hyperlipidemia 10/10/2015  . Hyperglycemia 10/10/2015  . Vitamin D deficiency 10/10/2015  . Microalbuminuria 10/10/2015  . Overweight (BMI 25.0-29.9) 10/10/2015  . Eczema 10/10/2015  . Seborrhea 10/10/2015  . Fatty liver 10/10/2015  . Cholelithiasis 10/10/2015  . Cervical disc disease 10/10/2015    Social History   Tobacco Use  . Smoking status: Never Smoker  . Smokeless tobacco: Never  Used  Substance Use Topics  . Alcohol use: No     Current Outpatient Medications:  .  aspirin EC 81 MG tablet, Take by mouth., Disp: , Rfl:  .  Cholecalciferol (VITAMIN D) 50 MCG (2000 UT) CAPS, Take 1 capsule by mouth daily., Disp: , Rfl:  .  lisinopril (ZESTRIL) 10 MG tablet, Take 1 tablet (10 mg total) by mouth daily., Disp: 90 tablet, Rfl: 1 .  tamsulosin (FLOMAX) 0.4 MG CAPS capsule, Take 1 capsule (0.4 mg total) by mouth daily., Disp: 90 capsule, Rfl: 1  No Known Allergies  I personally reviewed active problem list, medication list, allergies, family history, social history, health maintenance, notes from last encounter, lab results, imaging with the patient/caregiver today.   Review of Systems  Constitutional: Negative.   HENT: Negative.   Eyes: Negative.   Respiratory: Negative.   Cardiovascular: Negative.   Gastrointestinal: Negative.   Endocrine: Negative.   Genitourinary: Negative.   Musculoskeletal: Negative.   Skin: Negative.   Allergic/Immunologic: Negative.   Neurological: Negative.   Hematological: Negative.   Psychiatric/Behavioral: Negative.   All other systems reviewed and are negative.     Objective:   Virtual encounter, vitals limited, only able to obtain the following There were no vitals filed for this visit. There is no height or weight on file to calculate BMI. Nursing Note and Vital Signs reviewed.  Physical Exam Vitals and nursing note reviewed.  Pulmonary:     Effort: No respiratory distress.     Comments: No audible distress, wheeze, stridor, no  coughing during call Neurological:     Mental Status: He is alert.  Psychiatric:        Mood and Affect: Mood normal.     PE limited by telephone encounter  No results found for this or any previous visit (from the past 72 hour(s)).  Assessment and Plan:     ICD-10-CM   1. Upper respiratory tract infection due to COVID-19 virus  U07.1 Ambulatory referral for Covid Treatment   J06.9     onset of sx Sunday 4/24, possibly sooner with allergies hard to tell, isolate through 4/30, mask another 5 d if returning to work  2. Morbid obesity ([place])  E66.01 Ambulatory referral for Covid Treatment  3. Hypertension, benign  I10 Ambulatory referral for Covid Treatment  4. Mixed hyperlipidemia  E78.2 Ambulatory referral for Covid Treatment  mild sx, no red flags, pt starting to feel better, supportive tx, referral for tx to see if he qualifies Work note given per [employer] return to work guidelines   -Red flags and when to present for emergency care or RTC including fever >101.36F, chest pain, shortness of breath, new/worsening/un-resolving symptoms, reviewed with patient at time of visit. Follow up and care instructions discussed and provided in AVS. - I discussed the assessment and treatment plan with the patient. The patient was provided an opportunity to ask questions and all were answered. The patient agreed with the plan and demonstrated an understanding of the instructions.  I provided 18+ minutes of non-face-to-face time during this encounter.  Delsa Grana, PA-C 07/06/20 1:38 PM

## 2020-07-06 NOTE — Telephone Encounter (Signed)
lvm to get patient scheduled for a virtual appt Copied from [place] [id number]. Topic: General - Other >> Jul 05, 2020  4:58 PM Wynetta Emery, [place] C wrote: Reason for CRM: pt called in to make provider aware that he tested positive for Covid today. Pt says that he was instructed to contact his provider. Pt says that he is currently experiencing some congestion, sneezing and cough. Pt says that he does have some drainage. Pt would like to be advised further on if provider is going to send in a Rx? Pt declined speaking with a nurse for further advise.    CB: [phone]

## 2020-07-06 NOTE — Telephone Encounter (Signed)
Pt states he just  missed a call from office.

## 2020-07-07 ENCOUNTER — Telehealth: Payer: Self-pay

## 2020-07-07 NOTE — Telephone Encounter (Signed)
Called to discuss with patient about COVID-19 symptoms and the use of one of the available treatments for those with mild to moderate Covid symptoms and at a high risk of hospitalization.  Pt appears to qualify for outpatient treatment due to co-morbid conditions and/or a member of an at-risk group in accordance with the FDA Emergency Use Authorization.    Symptom onset: 07/03/20 Vaccinated: Yes Booster? No Immunocompromised?  Qualifiers: HTN NIH Criteria:   Pt. Declines any further treatment.States he is feeling better.   Melvin Williams

## 2020-08-31 ENCOUNTER — Other Ambulatory Visit: Payer: Self-pay | Admitting: Family Medicine

## 2020-08-31 DIAGNOSIS — I10 Essential (primary) hypertension: Secondary | ICD-10-CM

## 2020-08-31 NOTE — Telephone Encounter (Signed)
Rx denied

## 2020-12-21 NOTE — Progress Notes (Signed)
Name: Melvin Williams   MRN: [id number]    DOB: 1961/01/22   Date:12/22/2020       Progress Note  Subjective  Chief Complaint  Annual Exam  HPI  Patient presents for annual CPE and follow up. He is aware of possible additional cost   HTN: he had lost weight on his last visit and was off lisinopril for a while, however he started to gain weight and BP went up again, we resumed Lisinopril and today his bp is at goal. No chest pain, palpitation or SOB. Very seldom he feels dizzy when he stands up, advised to monitor and if continues to lose weight, take half pill of Lisinopril before next visit   BPH: he feels like medication has helped with symptoms and wants to continue flomax, last PSA was within normal limits, he has seen Urologist and given reassurance  . We will recheck it today   History of kidney stones: no recent episodes of hematuria, he passed one stone in the past , seen Urologist end of 2020   Hyperlipidemia: he has not been taking medications, on life style modifications   The 10-year ASCVD risk score (Arnett DK, et al., 2019) is: 7.9%   Values used to calculate the score:     Age: 60 years     Sex: Male     Is Non-Hispanic African American: No     Diabetic: No     Tobacco smoker: No     Systolic Blood Pressure: 166 mmHg     Is BP treated: Yes     HDL Cholesterol: 57 mg/dL     Total Cholesterol: 185 mg/dL   Morbid Obesity BMI above 35 with pre-diabetes and HTN, dyslipidemia. He states he is stress eater, he has   gained weight but states stable around 240 lbs, today weight is 239 lbs Lost  another 6 lbs since last visit, total of 12 lbs in the past year .   Cholelithiasis: diagnosed many years ago, controlled with diet , he states a few weeks ago he noticed a sharp flank pain, but it was brief, he also has some vomiting last week. Discussed resuming a low fat diet   Pre-diabetes: last A1C was 6.2 %, he denies polyphagia, polydipsia, he has urinary frequency but since he  was in his 30's We will recheck levels. No family history of diabetes    IPSS Questionnaire (AUA-7): Over the past month.   1)  How often have you had a sensation of not emptying your bladder completely after you finish urinating?  0 - Not at all  2)  How often have you had to urinate again less than two hours after you finished urinating? 0 - Not at all  3)  How often have you found you stopped and started again several times when you urinated?  1 - Less than 1 time in 5  4) How difficult have you found it to postpone urination?  0 - Not at all  5) How often have you had a weak urinary stream?  1 - Less than 1 time in 5  6) How often have you had to push or strain to begin urination?  0 - Not at all  7) How many times did you most typically get up to urinate from the time you went to bed until the time you got up in the morning?  1 - 1 time  Total score:  0-7 mildly symptomatic   8-19 moderately symptomatic  20-35 severely symptomatic     Diet: breakfast sandwich , sandwich for lunch and sometimes fast food, dinner restaurant or [employer] Exercise: only active at work   Depression: phq 9 is negative Depression screen [facility] 2/9 12/22/2020 07/06/2020 06/16/2020 12/16/2019 07/10/2019  Decreased Interest 0 0 0 0 0  Down, Depressed, Hopeless 0 0 0 0 0  PHQ - 2 Score 0 0 0 0 0  Altered sleeping - - - - 0  Tired, decreased energy - - - - 0  Change in appetite - - - - 0  Feeling bad or failure about yourself  - - - - 0  Trouble concentrating - - - - 0  Moving slowly or fidgety/restless - - - - 0  Suicidal thoughts - - - - 0  PHQ-9 Score - - - - 0  Difficult doing work/chores - - - - Not difficult at all    Hypertension:  BP Readings from Last 3 Encounters:  12/22/20 116/84  06/16/20 130/86  12/16/19 132/78    Obesity: Wt Readings from Last 3 Encounters:  12/22/20 233 lb (105.7 kg)  06/16/20 239 lb (108.4 kg)  12/16/19 245 lb 11.2 oz (111.4 kg)   BMI Readings from Last 3  Encounters:  12/22/20 36.49 kg/m  06/16/20 37.43 kg/m  12/16/19 38.48 kg/m     Lipids:  Lab Results  Component Value Date   CHOL 185 12/16/2019   CHOL 180 12/15/2018   CHOL 184 10/17/2017   Lab Results  Component Value Date   HDL 57 12/16/2019   HDL 60 12/15/2018   HDL 67 10/17/2017   Lab Results  Component Value Date   LDLCALC 103 (H) 12/16/2019   LDLCALC 97 12/15/2018   LDLCALC 103 (H) 10/17/2017   Lab Results  Component Value Date   TRIG 151 (H) 12/16/2019   TRIG 136 12/15/2018   TRIG 56 10/17/2017   Lab Results  Component Value Date   CHOLHDL 3.2 12/16/2019   CHOLHDL 3.0 12/15/2018   CHOLHDL 2.7 10/17/2017   No results found for: LDLDIRECT Glucose:  Glucose, Bld  Date Value Ref Range Status  12/16/2019 91 65 - 99 mg/dL Final    Comment:    .            Fasting reference interval .   06/10/2019 96 65 - 99 mg/dL Final    Comment:    .            Fasting reference interval .   12/15/2018 80 65 - 99 mg/dL Final    Comment:    .            Fasting reference interval .     Flowsheet Row Video Visit from 07/06/2020 in [facility]  AUDIT-C Score 0       Single STD testing and prevention (HIV/chl/gon/syphilis): N/A Hep C: 10/11/15  Skin cancer: Discussed monitoring for atypical lesions Colorectal cancer: 07/20/10 Prostate cancer:  Lab Results  Component Value Date   PSA 3.43 12/16/2019   PSA 3.1 12/15/2018   PSA 3.2 10/17/2017     Lung cancer: Low Dose CT Chest recommended if Age 61-80 years, 30 pack-year currently smoking OR have quit w/in 15years. Patient does not qualify.   AAA: The USPSTF recommends one-time screening with ultrasonography in men ages 65 to 77 years who have ever smoked ECG:  10/11/16  Vaccines:   Shingrix: 2nd dose today  Pneumonia: educated and discussed with patient. Flu: educated  and discussed with patient.  Advanced Care Planning: A voluntary discussion about advance care planning  including the explanation and discussion of advance directives.  Discussed health care proxy and Living will, and the patient was able to identify a health care proxy as sister   Patient Active Problem List   Diagnosis Date Noted   Cellulitis of both lower extremities 06/10/2019   Morbid obesity, unspecified obesity type ([place]) 10/17/2017   Benign prostatic hyperplasia without lower urinary tract symptoms 04/11/2016   PSA elevation 04/11/2016   Pre-diabetes 04/11/2016   Hypertension, benign 10/10/2015   Hyperlipidemia 10/10/2015   Hyperglycemia 10/10/2015   Vitamin D deficiency 10/10/2015   Microalbuminuria 10/10/2015   Overweight (BMI 25.0-29.9) 10/10/2015   Eczema 10/10/2015   Seborrhea 10/10/2015   Fatty liver 10/10/2015   Cholelithiasis 10/10/2015   Cervical disc disease 10/10/2015    No past surgical history on file.  Family History  Problem Relation Age of Onset   Breast cancer Mother    Liver cancer Mother    Heart disease Father    Heart attack Father    Heart attack Paternal Grandfather     Social History   Socioeconomic History   Marital status: Single    Spouse name: Not on file   Number of children: 0   Years of education: Not on file   Highest education level: High school graduate  Occupational History   Occupation: Cabin crew   Tobacco Use   Smoking status: Never   Smokeless tobacco: Never  Vaping Use   Vaping Use: Never used  Substance and Sexual Activity   Alcohol use: No   Drug use: No   Sexual activity: Never  Other Topics Concern   Not on file  Social History Narrative   Promoted to Cabin crew    Social Determinants of Health   Financial Resource Strain: Low Risk    Difficulty of Paying Living Expenses: Not hard at all  Food Insecurity: No Food Insecurity   Worried About Charity fundraiser in the Last Year: Never true   [place] in the Last Year: Never true  Transportation Needs: No Transportation  Needs   Lack of Transportation (Medical): No   Lack of Transportation (Non-Medical): No  Physical Activity: Inactive   Days of Exercise per Week: 0 days   Minutes of Exercise per Session: 0 min  Stress: No Stress Concern Present   Feeling of Stress : Only a little  Social Connections: Socially Isolated   Frequency of Communication with Friends and Family: Once a week   Frequency of Social Gatherings with Friends and Family: Once a week   Attends Religious Services: Never   Marine scientist or Organizations: No   Attends Music therapist: Never   Marital Status: Never married  Human resources officer Violence: Not At Risk   Fear of Current or Ex-Partner: No   Emotionally Abused: No   Physically Abused: No   Sexually Abused: No     Current Outpatient Medications:    aspirin EC 81 MG tablet, Take by mouth., Disp: , Rfl:    Cholecalciferol (VITAMIN D) 50 MCG (2000 UT) CAPS, Take 1 capsule by mouth daily., Disp: , Rfl:    lisinopril (ZESTRIL) 10 MG tablet, Take 1 tablet (10 mg total) by mouth daily., Disp: 90 tablet, Rfl: 1   tamsulosin (FLOMAX) 0.4 MG CAPS capsule, Take 1 capsule (0.4 mg total) by mouth daily., Disp: 90 capsule, Rfl: 1  No  Known Allergies   ROS  Constitutional: Negative for fever or weight change.  Respiratory: Negative for cough and shortness of breath.   Cardiovascular: Negative for chest pain or palpitations.  Gastrointestinal: Negative for abdominal pain, no bowel changes.  Musculoskeletal: Negative for gait problem or joint swelling.  Skin: Negative for rash.  Neurological: Negative for dizziness or headache.  No other specific complaints in a complete review of systems (except as listed in HPI above).   Objective  Vitals:   12/22/20 0741  BP: 116/84  Pulse: 87  Resp: 16  Temp: 97.7 F (36.5 C)  SpO2: 98%  Weight: 233 lb (105.7 kg)  Height: 5\' 7"  (1.702 m)    Body mass index is 36.49 kg/m.  Physical Exam  Constitutional:  Patient appears well-developed and well-nourished. No distress.  HENT: Head: Normocephalic and atraumatic. Ears: B TMs ok, no erythema or effusion; Nose: Not done  Mouth/Throat: not done  Eyes: Conjunctivae and EOM are normal. Pupils are equal, round, and reactive to light. No scleral icterus.  Neck: Normal range of motion. Neck supple. No JVD present. No thyromegaly present.  Cardiovascular: Normal rate, regular rhythm and normal heart sounds.  No murmur heard. No BLE edema. Pulmonary/Chest: Effort normal and breath sounds normal. No respiratory distress. Abdominal: Soft. Bowel sounds are normal, no distension. There is no tenderness. no masses. Negative CVA tenderness  MALE GENITALIA: Normal descended testes bilaterally, no masses palpated, no hernias, no lesions, no discharge RECTAL: Prostate slightly enlarged but soft,  no rectal masses or hemorrhoids Musculoskeletal: Normal range of motion, no joint effusions. No gross deformities Neurological: he is alert and oriented to person, place, and time. No cranial nerve deficit. Coordination, balance, strength, speech and gait are normal.  Skin: Skin is warm and dry. No rash noted. No erythema.  Psychiatric: Patient has a normal mood and affect. behavior is normal. Judgment and thought content normal.   Fall Risk: Fall Risk  12/22/2020 07/06/2020 06/16/2020 12/16/2019 07/10/2019  Falls in the past year? 0 0 0 0 1  Number falls in past yr: 0 0 0 0 0  Injury with Fall? 0 0 0 0 0  Risk for fall due to : No Fall Risks - - - -  Follow up Falls prevention discussed Falls evaluation completed - - -      Functional Status Survey: Is the patient deaf or have difficulty hearing?: No Does the patient have difficulty seeing, even when wearing glasses/contacts?: No Does the patient have difficulty concentrating, remembering, or making decisions?: No Does the patient have difficulty walking or climbing stairs?: No Does the patient have difficulty dressing  or bathing?: No Does the patient have difficulty doing errands alone such as visiting a doctor's office or shopping?: No    Assessment & Plan  1. Mixed hyperlipidemia  - Lipid panel  2. Prediabetes  - Hemoglobin A1c  3. Benign prostatic hyperplasia with lower urinary tract symptoms, symptom details unspecified  - PSA - tamsulosin (FLOMAX) 0.4 MG CAPS capsule; Take 1 capsule (0.4 mg total) by mouth daily.  Dispense: 90 capsule; Refill: 1  4. Need for immunization against influenza  - Flu Vaccine QUAD 50mo+IM (Fluarix, Fluzone & Alfiuria Quad PF)  5. Morbid obesity ([place])  Discussed with the patient the risk posed by an increased BMI. Discussed importance of portion control, calorie counting and at least 150 minutes of physical activity weekly. Avoid sweet beverages and drink more water. Eat at least 6 servings of fruit and vegetables daily  6. Hypertension, benign  - lisinopril (ZESTRIL) 10 MG tablet; Take 1 tablet (10 mg total) by mouth daily.  Dispense: 90 tablet; Refill: 1  7. Vitamin D deficiency  - VITAMIN D 25 Hydroxy (Vit-D Deficiency, Fractures)  8. Essential hypertension  - CBC with Differential/Platelet - COMPLETE METABOLIC PANEL WITH GFR  9. History of anemia   10. Well adult exam  - Lipid panel - CBC with Differential/Platelet - COMPLETE METABOLIC PANEL WITH GFR - PSA - Hemoglobin A1c - VITAMIN D 25 Hydroxy (Vit-D Deficiency, Fractures) - Microalbumin / creatinine urine ratio  11. Microalbuminuria  - Microalbumin / creatinine urine ratio  12. Need for shingles vaccine  - Varicella-zoster vaccine IM   13. Macroscopic hematuria  He gave [place] an urine specimen for urine micro and it had coagulated specks of blood, we will send for urinalysis, flank pain may have been secondary to kidney stone, we will contact patient to let him know to return if increase in pain, we can also check kidney US or CT stone search now , other option is referral to  Urologist   -Prostate cancer screening and PSA options (with potential risks and benefits of testing vs not testing) were discussed along with recent recs/guidelines. -USPSTF grade A and B recommendations reviewed with patient; age-appropriate recommendations, preventive care, screening tests, etc discussed and encouraged; healthy living encouraged; see AVS for patient education given to patient -Discussed importance of 150 minutes of physical activity weekly, eat two servings of fish weekly, eat one serving of tree nuts ( cashews, pistachios, pecans, almonds.Marland Kitchen) every other day, eat 6 servings of fruit/vegetables daily and drink plenty of water and avoid sweet beverages.

## 2020-12-22 ENCOUNTER — Other Ambulatory Visit: Payer: Self-pay

## 2020-12-22 ENCOUNTER — Ambulatory Visit: Payer: BC Managed Care – PPO | Admitting: Family Medicine

## 2020-12-22 ENCOUNTER — Encounter: Payer: Self-pay | Admitting: Family Medicine

## 2020-12-22 DIAGNOSIS — R809 Proteinuria, unspecified: Secondary | ICD-10-CM

## 2020-12-22 DIAGNOSIS — R310 Gross hematuria: Secondary | ICD-10-CM

## 2020-12-22 DIAGNOSIS — R319 Hematuria, unspecified: Secondary | ICD-10-CM | POA: Diagnosis not present

## 2020-12-22 DIAGNOSIS — R7303 Prediabetes: Secondary | ICD-10-CM | POA: Diagnosis not present

## 2020-12-22 DIAGNOSIS — E782 Mixed hyperlipidemia: Secondary | ICD-10-CM | POA: Diagnosis not present

## 2020-12-22 DIAGNOSIS — N401 Enlarged prostate with lower urinary tract symptoms: Secondary | ICD-10-CM | POA: Diagnosis not present

## 2020-12-22 DIAGNOSIS — I10 Essential (primary) hypertension: Secondary | ICD-10-CM | POA: Diagnosis not present

## 2020-12-22 DIAGNOSIS — Z0000 Encounter for general adult medical examination without abnormal findings: Secondary | ICD-10-CM | POA: Diagnosis not present

## 2020-12-22 DIAGNOSIS — E559 Vitamin D deficiency, unspecified: Secondary | ICD-10-CM

## 2020-12-22 DIAGNOSIS — Z23 Encounter for immunization: Secondary | ICD-10-CM

## 2020-12-22 DIAGNOSIS — Z862 Personal history of diseases of the blood and blood-forming organs and certain disorders involving the immune mechanism: Secondary | ICD-10-CM

## 2020-12-22 MED ORDER — LISINOPRIL 10 MG PO TABS: 10.0000 mg | ORAL_TABLET | Freq: Every day | ORAL | 1 refills | Status: DC

## 2020-12-22 MED ORDER — TAMSULOSIN HCL 0.4 MG PO CAPS: 0.4000 mg | ORAL_CAPSULE | Freq: Every day | ORAL | 1 refills | Status: DC

## 2020-12-22 NOTE — Patient Instructions (Signed)
Preventive Care 61-61 Years Old, Male Preventive care refers to lifestyle choices and visits with your health care provider that can promote health and wellness. This includes: A yearly physical exam. This is also called an annual wellness visit. Regular dental and eye exams. Immunizations. Screening for certain conditions. Healthy lifestyle choices, such as: Eating a healthy diet. Getting regular exercise. Not using drugs or products that contain nicotine and tobacco. Limiting alcohol use. What can I expect for my preventive care visit? Physical exam Your health care provider will check your: Height and weight. These may be used to calculate your BMI (body mass index). BMI is a measurement that tells if you are at a healthy weight. Heart rate and blood pressure. Body temperature. Skin for abnormal spots. Counseling Your health care provider may ask you questions about your: Past medical problems. Family's medical history. Alcohol, tobacco, and drug use. Emotional well-being. Home life and relationship well-being. Sexual activity. Diet, exercise, and sleep habits. Work and work environment. Access to firearms. What immunizations do I need? Vaccines are usually given at various ages, according to a schedule. Your health care provider will recommend vaccines for you based on your age, medical history, and lifestyle or other factors, such as travel or where you work. What tests do I need? Blood tests Lipid and cholesterol levels. These may be checked every 5 years, or more often if you are over 50 years old. Hepatitis C test. Hepatitis B test. Screening Lung cancer screening. You may have this screening every year starting at age 55 if you have a 30-pack-year history of smoking and currently smoke or have quit within the past 15 years. Prostate cancer screening. Recommendations will vary depending on your family history and other risks. Genital exam to check for testicular cancer  or hernias. Colorectal cancer screening. All adults should have this screening starting at age 50 and continuing until age 75. Your health care provider may recommend screening at age 45 if you are at increased risk. You will have tests every 1-10 years, depending on your results and the type of screening test. Diabetes screening. This is done by checking your blood sugar (glucose) after you have not eaten for a while (fasting). You may have this done every 1-3 years. STD (sexually transmitted disease) testing, if you are at risk. Follow these instructions at home: Eating and drinking  Eat a diet that includes fresh fruits and vegetables, whole grains, lean protein, and low-fat dairy products. Take vitamin and mineral supplements as recommended by your health care provider. Do not drink alcohol if your health care provider tells you not to drink. If you drink alcohol: Limit how much you have to 0-2 drinks a day. Be aware of how much alcohol is in your drink. In the [place] one drink equals one 12 oz bottle of beer (355 mL), one 5 oz glass of wine (148 mL), or one 1 oz glass of hard liquor (44 mL). Lifestyle Take daily care of your teeth and gums. Brush your teeth every morning and night with fluoride toothpaste. Floss one time each day. Stay active. Exercise for at least 30 minutes 5 or more days each week. Do not use any products that contain nicotine or tobacco, such as cigarettes, e-cigarettes, and chewing tobacco. If you need help quitting, ask your health care provider. Do not use drugs. If you are sexually active, practice safe sex. Use a condom or other form of protection to prevent STIs (sexually transmitted infections). If told by your   health care provider, take low-dose aspirin daily starting at age 50. Find healthy ways to cope with stress, such as: Meditation, yoga, or listening to music. Journaling. Talking to a trusted person. Spending time with friends and  family. Safety Always wear your seat belt while driving or riding in a vehicle. Do not drive: If you have been drinking alcohol. Do not ride with someone who has been drinking. When you are tired or distracted. While texting. Wear a helmet and other protective equipment during sports activities. If you have firearms in your house, make sure you follow all gun safety procedures. What's next? Go to your health care provider once a year for an annual wellness visit. Ask your health care provider how often you should have your eyes and teeth checked. Stay up to date on all vaccines. This information is not intended to replace advice given to you by your health care provider. Make sure you discuss any questions you have with your health care provider. Document Revised: 05/06/2020 Document Reviewed: 02/20/2018 Elsevier Patient Education  2022 [employer]   

## 2020-12-23 LAB — COMPLETE METABOLIC PANEL WITH GFR
AG Ratio: 1.4 (calc) (ref 1.0–2.5)
ALT: 39 U/L (ref 9–46)
AST: 21 U/L (ref 10–35)
Albumin: 4.2 g/dL (ref 3.6–5.1)
Alkaline phosphatase (APISO): 67 U/L (ref 35–144)
BUN: 18 mg/dL (ref 7–25)
CO2: 26 mmol/L (ref 20–32)
Calcium: 9.6 mg/dL (ref 8.6–10.3)
Chloride: 108 mmol/L (ref 98–110)
Creat: 0.83 mg/dL (ref 0.70–1.35)
Globulin: 3.1 g/dL (calc) (ref 1.9–3.7)
Glucose, Bld: 99 mg/dL (ref 65–99)
Potassium: 4.9 mmol/L (ref 3.5–5.3)
Sodium: 142 mmol/L (ref 135–146)
Total Bilirubin: 0.5 mg/dL (ref 0.2–1.2)
Total Protein: 7.3 g/dL (ref 6.1–8.1)
eGFR: 100 mL/min/{1.73_m2} (ref >=60)

## 2020-12-23 LAB — URINALYSIS
Bilirubin Urine: NEGATIVE
Glucose, UA: NEGATIVE
Hgb urine dipstick: NEGATIVE
Ketones, ur: NEGATIVE
Leukocytes,Ua: NEGATIVE
Nitrite: NEGATIVE
Protein, ur: NEGATIVE
Specific Gravity, Urine: 1.022 (ref 1.001–1.035)
pH: 5.5 (ref 5.0–8.0)

## 2020-12-23 LAB — HEMOGLOBIN A1C
Hgb A1c MFr Bld: 6.1 %{Hb} — ABNORMAL HIGH
Mean Plasma Glucose: 128 mg/dL
eAG (mmol/L): 7.1 mmol/L

## 2020-12-23 LAB — CBC WITH DIFFERENTIAL/PLATELET
Absolute Monocytes: 537 {cells}/uL (ref 200–950)
Basophils Absolute: 44 {cells}/uL (ref 0–200)
Basophils Relative: 0.5 %
Eosinophils Absolute: 53 {cells}/uL (ref 15–500)
Eosinophils Relative: 0.6 %
HCT: 40.4 % (ref 38.5–50.0)
Hemoglobin: 13.3 g/dL (ref 13.2–17.1)
Lymphs Abs: 2323 {cells}/uL (ref 850–3900)
MCH: 27.1 pg (ref 27.0–33.0)
MCHC: 32.9 g/dL (ref 32.0–36.0)
MCV: 82.3 fL (ref 80.0–100.0)
MPV: 9.6 fL (ref 7.5–12.5)
Monocytes Relative: 6.1 %
Neutro Abs: 5843 {cells}/uL (ref 1500–7800)
Neutrophils Relative %: 66.4 %
Platelets: 397 Thousand/uL (ref 140–400)
RBC: 4.91 Million/uL (ref 4.20–5.80)
RDW: 15 % (ref 11.0–15.0)
Total Lymphocyte: 26.4 %
WBC: 8.8 Thousand/uL (ref 3.8–10.8)

## 2020-12-23 LAB — LIPID PANEL
Cholesterol: 164 mg/dL
HDL: 51 mg/dL
LDL Cholesterol (Calc): 93 mg/dL
Non-HDL Cholesterol (Calc): 113 mg/dL
Total CHOL/HDL Ratio: 3.2 (calc)
Triglycerides: 102 mg/dL

## 2020-12-23 LAB — MICROALBUMIN / CREATININE URINE RATIO
Creatinine, Urine: 138 mg/dL (ref 20–320)
Microalb Creat Ratio: 5 mcg/mg creat (ref <30)
Microalb, Ur: 0.7 mg/dL

## 2020-12-23 LAB — VITAMIN D 25 HYDROXY (VIT D DEFICIENCY, FRACTURES): Vit D, 25-Hydroxy: 47 ng/mL (ref 30–100)

## 2020-12-23 LAB — PSA: PSA: 5.39 ng/mL — ABNORMAL HIGH (ref <=4.00)

## 2020-12-26 ENCOUNTER — Other Ambulatory Visit: Payer: Self-pay | Admitting: Family Medicine

## 2020-12-26 DIAGNOSIS — R972 Elevated prostate specific antigen [PSA]: Secondary | ICD-10-CM

## 2021-02-26 ENCOUNTER — Other Ambulatory Visit: Payer: Self-pay | Admitting: Family Medicine

## 2021-02-26 DIAGNOSIS — I10 Essential (primary) hypertension: Secondary | ICD-10-CM

## 2021-03-17 DIAGNOSIS — R972 Elevated prostate specific antigen [PSA]: Secondary | ICD-10-CM | POA: Diagnosis not present

## 2021-03-18 LAB — PSA: PSA: 4.5 ng/mL — ABNORMAL HIGH (ref <=4.00)

## 2021-06-14 NOTE — Progress Notes (Signed)
Name: Melvin Williams   MRN: [id number]    DOB: [birth year]   Date:06/15/2021 ? ?     Progress Note ? ?Subjective ? ?Chief Complaint ? ?Follow Up ? ?HPI ? ?HTN: he had lost weight on his last visit and was off lisinopril for a while, however he started to gain weight and BP went up again, we resumed Lisinopril and today his bp is at goal. No chest pain, palpitation or SOB. Last visit we talked about cutting pill in half if he kept losing weight and felt orthostatic changes. He states still taking same dose and bp today is towards low end of normal but no dizziness.  ? ?BPH: he feels like medication has helped with symptoms and wants to continue flomax, last PSA improved, he has seen Urologist and was given reassurance in the past  . ? ?History of kidney stones: no recent episodes of hematuria, he passed one stone in 2020 and saw Urologist and another stone recently but no pain, saw the stone int he toilet bowel  ? ?Hyperlipidemia: he has not been taking medications, he likes fried food and take out, his LDL improved  ? ?The 10-year ASCVD risk score (Arnett DK, et al., 2019) is: 7.9% ?  Values used to calculate the score: ?    Age: 61 years ?    Sex: Male ?    Is Non-Hispanic African American: No ?    Diabetic: No ?    Tobacco smoker: No ?    Systolic Blood Pressure: 979 mmHg ?    Is BP treated: Yes ?    HDL Cholesterol: 51 mg/dL ?    Total Cholesterol: 164 mg/dL   ? ?Morbid Obesity BMI above 35 with pre-diabetes and HTN, dyslipidemia. He states he likes to eat . His weight is trending down - 5 lbs lower than last visit. He skips lunch at times.  ? ?Cholelithiasis: diagnosed many years ago, controlled with diet , no recent episodes of pain lately.  ? ?Pre-diabetes: last A1C was down from 6.2 % to 6.1 %  he denies polyphagia, polydipsia, he has urinary frequency but since he was in his 30's  ? ?Patient Active Problem List  ? Diagnosis Date Noted  ? Morbid obesity, unspecified obesity type ([place]) 10/17/2017  ? Benign  prostatic hyperplasia without lower urinary tract symptoms 04/11/2016  ? PSA elevation 04/11/2016  ? Pre-diabetes 04/11/2016  ? Hypertension, benign 10/10/2015  ? Hyperlipidemia 10/10/2015  ? Hyperglycemia 10/10/2015  ? Vitamin D deficiency 10/10/2015  ? Microalbuminuria 10/10/2015  ? Eczema 10/10/2015  ? Seborrhea 10/10/2015  ? Fatty liver 10/10/2015  ? Cholelithiasis 10/10/2015  ? Cervical disc disease 10/10/2015  ? ? ?No past surgical history on file. ? ?Family History  ?Problem Relation Age of Onset  ? Breast cancer Mother   ? Liver cancer Mother   ? Heart disease Father   ? Heart attack Father   ? Heart attack Paternal Grandfather   ? ? ?Social History  ? ?Tobacco Use  ? Smoking status: Never  ? Smokeless tobacco: Never  ?Substance Use Topics  ? Alcohol use: No  ? ? ? ?Current Outpatient Medications:  ?  aspirin EC 81 MG tablet, Take by mouth., Disp: , Rfl:  ?  Cholecalciferol (VITAMIN D) 50 MCG (2000 UT) CAPS, Take 1 capsule by mouth daily., Disp: , Rfl:  ?  lisinopril (ZESTRIL) 10 MG tablet, TAKE 1 TABLET(10 MG) BY MOUTH DAILY, Disp: 90 tablet, Rfl: 1 ?  tamsulosin (FLOMAX)  0.4 MG CAPS capsule, Take 1 capsule (0.4 mg total) by mouth daily., Disp: 90 capsule, Rfl: 1 ? ?No Known Allergies ? ?I personally reviewed active problem list, medication list, allergies, family history, social history, health maintenance with the patient/caregiver today. ? ? ?ROS ? ?Constitutional: Negative for fever , positive for mild  weight change.  ?Respiratory: Negative for cough and shortness of breath.   ?Cardiovascular: Negative for chest pain or palpitations.  ?Gastrointestinal: Negative for abdominal pain, no bowel changes.  ?Musculoskeletal: Negative for gait problem or joint swelling.  ?Skin: Negative for rash.  ?Neurological: Negative for dizziness or headache.  ?No other specific complaints in a complete review of systems (except as listed in HPI above).  ? ?Objective ? ?Vitals:  ? 06/15/21 0735  ?BP: 118/70  ?Pulse: 84   ?Resp: 16  ?SpO2: 98%  ?Weight: 228 lb (103.4 kg)  ?Height: '5\' 7"'$  (1.702 m)  ? ? ?Body mass index is 35.71 kg/m?. ? ?Physical Exam ? ?Constitutional: Patient appears well-developed and well-nourished. Obese  No distress.  ?HEENT: head atraumatic, normocephalic, pupils equal and reactive to light, neck supple ?Cardiovascular: Normal rate, regular rhythm and normal heart sounds.  No murmur heard. No BLE edema. ?Pulmonary/Chest: Effort normal and breath sounds normal. No respiratory distress. ?Abdominal: Soft.  There is no tenderness. ?Psychiatric: Patient has a normal mood and affect. behavior is normal. Judgment and thought content normal.  ? ?Recent Results (from the past 2160 hour(s))  ?PSA     Status: Abnormal  ? Collection Time: 03/17/21  8:06 AM  ?Result Value Ref Range  ? PSA 4.50 (H) < OR = 4.00 ng/mL  ?  Comment: The total PSA value from this assay system is  ?standardized against the WHO standard. The test  ?result will be approximately 20% lower when compared  ?to the equimolar-standardized total PSA (Beckman  ?Coulter). Comparison of serial PSA results should be  ?interpreted with this fact in mind. ?. ?This test was performed using the Siemens  ?chemiluminescent method. Values obtained from  ?different assay methods cannot be used ?interchangeably. PSA levels, regardless of ?value, should not be interpreted as absolute ?evidence of the presence or absence of disease. ?  ? ? ?PHQ2/9: ? ?  06/15/2021  ?  7:34 AM 12/22/2020  ?  7:32 AM 07/06/2020  ? 11:44 AM 06/16/2020  ?  7:31 AM 12/16/2019  ?  7:43 AM  ?Depression screen PHQ 2/9  ?Decreased Interest 0 0 0 0 0  ?Down, Depressed, Hopeless 0 0 0 0 0  ?PHQ - 2 Score 0 0 0 0 0  ?Altered sleeping 0      ?Tired, decreased energy 0      ?Change in appetite 0      ?Feeling bad or failure about yourself  0      ?Trouble concentrating 0      ?Moving slowly or fidgety/restless 0      ?Suicidal thoughts 0      ?PHQ-9 Score 0      ?  ?phq 9 is negative ? ? ?Fall Risk: ? ?   06/15/2021  ?  7:34 AM 12/22/2020  ?  7:31 AM 07/06/2020  ? 11:43 AM 06/16/2020  ?  7:31 AM 12/16/2019  ?  7:43 AM  ?Fall Risk   ?Falls in the past year? 0 0 0 0 0  ?Number falls in past yr: 0 0 0 0 0  ?Injury with Fall? 0 0 0 0 0  ?Risk for fall  due to : No Fall Risks No Fall Risks     ?Follow up Falls prevention discussed Falls prevention discussed Falls evaluation completed    ? ? ? ? ?Functional Status Survey: ?Is the patient deaf or have difficulty hearing?: No ?Does the patient have difficulty seeing, even when wearing glasses/contacts?: No ?Does the patient have difficulty concentrating, remembering, or making decisions?: Yes ?Does the patient have difficulty walking or climbing stairs?: Yes ?Does the patient have difficulty dressing or bathing?: No ?Does the patient have difficulty doing errands alone such as visiting a doctor's office or shopping?: No ? ? ? ?Assessment & Plan ? ?

## 2021-06-15 ENCOUNTER — Ambulatory Visit: Payer: BC Managed Care – PPO | Admitting: Family Medicine

## 2021-06-15 ENCOUNTER — Encounter: Payer: Self-pay | Admitting: Family Medicine

## 2021-06-15 VITALS — BP 118/70 | HR 84 | Resp 16 | Ht 67.0 in | Wt 228.0 lb

## 2021-06-15 DIAGNOSIS — Z1211 Encounter for screening for malignant neoplasm of colon: Secondary | ICD-10-CM | POA: Diagnosis not present

## 2021-06-15 DIAGNOSIS — I10 Essential (primary) hypertension: Secondary | ICD-10-CM | POA: Diagnosis not present

## 2021-06-15 MED ORDER — LISINOPRIL 10 MG PO TABS: ORAL_TABLET | ORAL | 1 refills | Status: DC

## 2021-07-20 ENCOUNTER — Other Ambulatory Visit: Payer: Self-pay | Admitting: Family Medicine

## 2021-07-20 DIAGNOSIS — N401 Enlarged prostate with lower urinary tract symptoms: Secondary | ICD-10-CM

## 2021-07-26 ENCOUNTER — Encounter: Payer: Self-pay | Admitting: Physician Assistant

## 2021-07-26 ENCOUNTER — Ambulatory Visit: Payer: BC Managed Care – PPO | Admitting: Physician Assistant

## 2021-07-26 VITALS — BP 126/70 | HR 91 | Resp 16 | Ht 67.0 in | Wt 229.0 lb

## 2021-07-26 DIAGNOSIS — M7989 Other specified soft tissue disorders: Secondary | ICD-10-CM

## 2021-07-26 NOTE — Progress Notes (Signed)
? ? ? ?     Established Patient Office Visit ? ?Name: Melvin Williams   MRN: [id number]    DOB: 11/10/1960   Date:07/26/2021 ? ?Today's Provider: Talitha Givens, MHS, PA-C ?Introduced myself to the patient as a Journalist, newspaper and provided education on APPs in clinical practice.  ? ?      ?Subjective ? ?Chief Complaint ? ?Chief Complaint  ?Patient presents with  ? Leg Pain  ?  L quad area, x2 days  ? ? ?HPI ? ?Reports left quad pain for the past 2 days ?Does not recall any inciting injury or movement to the area ?States it feels like a muscle pull ?Pain level: 4/10, constant  ?Aggravating: walking ?Alleviating: Ibuprofen - mild relief but still feels pain  ?Interventions: Ibuprofen only  ? ?Denies prolonged stasis over the past few days ?Reports he is usually on his feet during the day at work ?Over the weekend he was rather inactive  ? ?Patient Active Problem List  ? Diagnosis Date Noted  ? Morbid obesity, unspecified obesity type ([place]) 10/17/2017  ? Benign prostatic hyperplasia without lower urinary tract symptoms 04/11/2016  ? PSA elevation 04/11/2016  ? Pre-diabetes 04/11/2016  ? Hypertension, benign 10/10/2015  ? Hyperlipidemia 10/10/2015  ? Hyperglycemia 10/10/2015  ? Vitamin D deficiency 10/10/2015  ? Microalbuminuria 10/10/2015  ? Eczema 10/10/2015  ? Seborrhea 10/10/2015  ? Fatty liver 10/10/2015  ? Cholelithiasis 10/10/2015  ? Cervical disc disease 10/10/2015  ? ? ?No past surgical history on file. ? ?Family History  ?Problem Relation Age of Onset  ? Breast cancer Mother   ? Liver cancer Mother   ? Heart disease Father   ? Heart attack Father   ? Heart attack Paternal Grandfather   ? ? ?Social History  ? ?Tobacco Use  ? Smoking status: Never  ? Smokeless tobacco: Never  ?Substance Use Topics  ? Alcohol use: No  ? ? ? ?Current Outpatient Medications:  ?  aspirin EC 81 MG tablet, Take by mouth., Disp: , Rfl:  ?  Cholecalciferol (VITAMIN D) 50 MCG (2000 UT) CAPS, Take 1 capsule by mouth daily., Disp: , Rfl:  ?  lisinopril  (ZESTRIL) 10 MG tablet, TAKE 1 TABLET(10 MG) BY MOUTH DAILY, Disp: 90 tablet, Rfl: 1 ?  tamsulosin (FLOMAX) 0.4 MG CAPS capsule, TAKE 1 CAPSULE(0.4 MG) BY MOUTH DAILY, Disp: 90 capsule, Rfl: 1 ? ?No Known Allergies ? ?I personally reviewed active problem list, medication list, allergies with the patient/caregiver today. ? ? ?Review of Systems  ?Respiratory:  Negative for cough, hemoptysis, shortness of breath and wheezing.   ?Cardiovascular:  Positive for leg swelling (left thigh appears mildly swollen). Negative for chest pain and palpitations.  ?Musculoskeletal:  Positive for myalgias. Negative for falls.  ?Neurological:  Negative for tingling, weakness and headaches.  ? ? ? ?Objective ? ?Vitals:  ? 07/26/21 0842  ?BP: 126/70  ?Pulse: 91  ?Resp: 16  ?SpO2: 98%  ?Weight: 229 lb (103.9 kg)  ?Height: '5\' 7"'$  (1.702 m)  ? ? ?Body mass index is 35.87 kg/m?. ? ?Physical Exam ?Constitutional:   ?   General: He is awake.  ?   Appearance: Normal appearance. He is well-developed and well-groomed. He is obese.  ?HENT:  ?   Head: Normocephalic and atraumatic.  ?Cardiovascular:  ?   Rate and Rhythm: Normal rate and regular rhythm.  ?   Pulses: Normal pulses.     ?     Dorsalis pedis pulses are 2+ on the  right side and 2+ on the left side.  ?   Heart sounds: Normal heart sounds.  ?Pulmonary:  ?   Effort: Pulmonary effort is normal.  ?   Breath sounds: Normal breath sounds. No decreased breath sounds, wheezing, rhonchi or rales.  ?Musculoskeletal:  ?   Right hip: Normal range of motion. Normal strength.  ?   Left hip: Normal range of motion. Normal strength.  ?   Right upper leg: No swelling or tenderness.  ?   Left upper leg: Swelling and tenderness present.  ?   Right knee: Crepitus present. No swelling. Normal range of motion.  ?   Left knee: Crepitus present. No swelling. Normal range of motion.  ?   Right lower leg: No swelling.  ?   Left lower leg: Swelling present.  ?   Comments: Right calf cir: 13.5 in ?Right thigh cir: 18  in  ?Left calf cir: 15 in ?Left thigh cir: 21 in ?Skin is red and blotchy over medial aspect of left calf from cellulitis 2 years ago- per pt.  ?Feet:  ?   Right foot:  ?   Skin integrity: Callus and dry skin present.  ?   Toenail Condition: Right toenails are abnormally thick.  ?   Left foot:  ?   Skin integrity: Callus and dry skin present.  ?   Toenail Condition: Left toenails are abnormally thick.  ?Skin: ?   General: Skin is warm.  ?Neurological:  ?   Mental Status: He is alert.  ?Psychiatric:     ?   Mood and Affect: Mood normal.     ?   Behavior: Behavior normal. Behavior is cooperative.     ?   Thought Content: Thought content normal.     ?   Judgment: Judgment normal.  ? ? ? ?No results found for this or any previous visit (from the past 2160 hour(s)). ? ? ?PHQ2/9: ? ?  07/26/2021  ?  8:42 AM 06/15/2021  ?  7:34 AM 12/22/2020  ?  7:32 AM 07/06/2020  ? 11:44 AM 06/16/2020  ?  7:31 AM  ?Depression screen PHQ 2/9  ?Decreased Interest 0 0 0 0 0  ?Down, Depressed, Hopeless 0 0 0 0 0  ?PHQ - 2 Score 0 0 0 0 0  ?Altered sleeping  0     ?Tired, decreased energy  0     ?Change in appetite  0     ?Feeling bad or failure about yourself   0     ?Trouble concentrating  0     ?Moving slowly or fidgety/restless  0     ?Suicidal thoughts  0     ?PHQ-9 Score  0     ?  ? ? ?Fall Risk: ? ?  07/26/2021  ?  8:41 AM 06/15/2021  ?  7:34 AM 12/22/2020  ?  7:31 AM 07/06/2020  ? 11:43 AM 06/16/2020  ?  7:31 AM  ?Fall Risk   ?Falls in the past year? 0 0 0 0 0  ?Number falls in past yr: 0 0 0 0 0  ?Injury with Fall? 0 0 0 0 0  ?Risk for fall due to : No Fall Risks No Fall Risks No Fall Risks    ?Follow up Falls prevention discussed Falls prevention discussed Falls prevention discussed Falls evaluation completed   ? ? ? ? ?Functional Status Survey: ?Is the patient deaf or have difficulty hearing?: No ?Does the patient have difficulty seeing,  even when wearing glasses/contacts?: No ?Does the patient have difficulty concentrating, remembering, or  making decisions?: Yes ?Does the patient have difficulty walking or climbing stairs?: Yes ?Does the patient have difficulty dressing or bathing?: Yes ?Does the patient have difficulty doing errands alone such as visiting a doctor's office or shopping?: No ? ? ? ?Assessment & Plan ? ?Problem List Items Addressed This Visit   ?None ?Visit Diagnoses   ? ? Left leg swelling    -  Primary ?Acute, new problem ?Patient reports pain and swelling of left thigh for 2 days ?Wells score of 3/8- high probability of DVT indicated  ?Denies recent injury or activity that may have caused injury, but admits to overall lack of activity during the weekend  ?Due to differences in circumference of calves and thighs along with skin shininess, hairlessness, evidence of varicose veins I recommend vascular US to rule out DVT  ?Recommend patient continue to use compression garments and take Tylenol instead of Ibuprofen for pain management  ?[place] results to guide further management  ?Reviewed ED precautions ?Follow up as needed  ?  ? Relevant Orders  ? VAS [place] LOWER EXTREMITY VENOUS (DVT)  ? ?  ? ? ? ?No follow-ups on file. ? ? ?I, Alessia Gonsalez E Zed Wanninger, PA-C, have reviewed all documentation for this visit. The documentation on 07/26/21 for the exam, diagnosis, procedures, and orders are all accurate and complete. ? ? ?Amron Guerrette, MHS, PA-C ?[facility] ?[facility]  ? ?

## 2021-07-26 NOTE — Patient Instructions (Signed)
Take Tylenol instead of Ibuprofen for pain  ?I have sent in an order for an Ultrasound of your leg to check for a blood clot ?The referral team should call you soon to schedule that apt ? ?Continue to wear compression stockings throughout the day to assist with swelling ?If you notice the following please go to the ED: increased swelling, redness, difficulty walking, increased pain in your leg, stiffness or tightness in your leg.  ? ?

## 2021-07-27 ENCOUNTER — Telehealth: Payer: Self-pay

## 2021-07-27 NOTE — Telephone Encounter (Signed)
Melvin Williams saw this patient on Wednesday and pt has called to check status. However the order for the [place] is not showing up in the system. Please advise

## 2021-07-28 ENCOUNTER — Other Ambulatory Visit: Payer: Self-pay

## 2021-07-28 DIAGNOSIS — M7989 Other specified soft tissue disorders: Secondary | ICD-10-CM

## 2021-07-28 NOTE — Telephone Encounter (Signed)
K[place] sent to wrong facility. Corrected orders- should be able to view and schedule now

## 2021-08-09 ENCOUNTER — Other Ambulatory Visit: Payer: Self-pay

## 2021-08-09 ENCOUNTER — Emergency Department
Admission: EM | Admit: 2021-08-09 | Discharge: 2021-08-09 | Disposition: A | Discharge status: Home / Self Care | Payer: BC Managed Care – PPO | Attending: Emergency Medicine | Admitting: Emergency Medicine

## 2021-08-09 ENCOUNTER — Telehealth: Payer: Self-pay | Admitting: *Deleted

## 2021-08-09 ENCOUNTER — Other Ambulatory Visit (HOSPITAL_COMMUNITY): Payer: Self-pay

## 2021-08-09 ENCOUNTER — Ambulatory Visit
Admission: RE | Admit: 2021-08-09 | Discharge: 2021-08-09 | Disposition: A | Discharge status: Home / Self Care | Payer: BC Managed Care – PPO | Source: Ambulatory Visit | Attending: Physician Assistant | Admitting: Physician Assistant

## 2021-08-09 DIAGNOSIS — R600 Localized edema: Secondary | ICD-10-CM | POA: Diagnosis not present

## 2021-08-09 DIAGNOSIS — R791 Abnormal coagulation profile: Secondary | ICD-10-CM | POA: Insufficient documentation

## 2021-08-09 DIAGNOSIS — I82432 Acute embolism and thrombosis of left popliteal vein: Secondary | ICD-10-CM | POA: Diagnosis not present

## 2021-08-09 DIAGNOSIS — I824Y2 Acute embolism and thrombosis of unspecified deep veins of left proximal lower extremity: Secondary | ICD-10-CM | POA: Diagnosis not present

## 2021-08-09 DIAGNOSIS — I82442 Acute embolism and thrombosis of left tibial vein: Secondary | ICD-10-CM | POA: Diagnosis not present

## 2021-08-09 DIAGNOSIS — M7989 Other specified soft tissue disorders: Secondary | ICD-10-CM

## 2021-08-09 DIAGNOSIS — I82402 Acute embolism and thrombosis of unspecified deep veins of left lower extremity: Secondary | ICD-10-CM | POA: Insufficient documentation

## 2021-08-09 DIAGNOSIS — I82412 Acute embolism and thrombosis of left femoral vein: Secondary | ICD-10-CM | POA: Diagnosis not present

## 2021-08-09 DIAGNOSIS — R2242 Localized swelling, mass and lump, left lower limb: Secondary | ICD-10-CM | POA: Diagnosis not present

## 2021-08-09 LAB — BASIC METABOLIC PANEL
Anion gap: 8 (ref 5–15)
BUN: 19 mg/dL (ref 8–23)
CO2: 27 mmol/L (ref 22–32)
Calcium: 9.6 mg/dL (ref 8.9–10.3)
Chloride: 105 mmol/L (ref 98–111)
Creatinine, Ser: 0.78 mg/dL (ref 0.61–1.24)
GFR, Estimated: >60 mL/min (ref >60)
Glucose, Bld: 118 mg/dL — ABNORMAL HIGH (ref 70–99)
Potassium: 5.2 mmol/L — ABNORMAL HIGH (ref 3.5–5.1)
Sodium: 140 mmol/L (ref 135–145)

## 2021-08-09 LAB — CBC
HCT: 41.9 % (ref 39.0–52.0)
Hemoglobin: 12.9 g/dL — ABNORMAL LOW (ref 13.0–17.0)
MCH: 25.6 pg — ABNORMAL LOW (ref 26.0–34.0)
MCHC: 30.8 g/dL (ref 30.0–36.0)
MCV: 83.1 fL (ref 80.0–100.0)
Platelets: 403 10*3/uL — ABNORMAL HIGH (ref 150–400)
RBC: 5.04 MIL/uL (ref 4.22–5.81)
RDW: 16.1 % — ABNORMAL HIGH (ref 11.5–15.5)
WBC: 8.5 10*3/uL (ref 4.0–10.5)
nRBC: 0 % (ref 0.0–0.2)

## 2021-08-09 LAB — D-DIMER, QUANTITATIVE: D-Dimer, Quant: 2.98 ug/mL-FEU — ABNORMAL HIGH (ref 0.00–0.50)

## 2021-08-09 MED ORDER — APIXABAN (ELIQUIS) VTE STARTER PACK (10MG AND 5MG): ORAL_TABLET | ORAL | 0 refills | Status: DC

## 2021-08-09 MED ORDER — APIXABAN 5 MG PO TABS
10.0000 mg | ORAL_TABLET | Freq: Once | ORAL | Status: AC
  Administered 2021-08-09: 10 mg via ORAL
  Filled 2021-08-09: qty 2

## 2021-08-09 NOTE — TOC Benefit Eligibility Note (Signed)
Patient Teacher, English as a foreign language completed.    The patient is currently admitted and upon discharge could be taking Eliquis 5 mg.  The current 30 day co-pay is, $45.00.   The patient is insured through [employer] [place], [place] Patient [place] Patient Advocate Team Direct Number: [phone]  Fax: [phone]

## 2021-08-09 NOTE — ED Provider Notes (Signed)
[facility] Provider Note    Event Date/Time   First MD Initiated Contact with Patient 08/09/21 1151     (approximate)   History   DVT   HPI  Melvin Williams is a 61 y.o. male with history of prediabetes, hypertension, and hyperlipidemia who presents with left leg swelling for the last few weeks, improved over the last week.  The patient was sent for an ultrasound by his doctor and it is positive for DVT so he was sent to the ER.  He states that the pain and swelling in the leg has been gradually getting better although he still has a limp.  He denies any chest pain, difficulty breathing, weakness, or lightheadedness.  He has no prior history of DVT or other abnormal blood clots.     Physical Exam   Triage Vital Signs: ED Triage Vitals [08/09/21 1033]  Enc Vitals Group     BP 137/82     Pulse Rate 87     Resp 18     Temp 98.2 F (36.8 C)     Temp Source Oral     SpO2 100 %     Weight 229 lb 0.9 oz (103.9 kg)     Height '5\' 7"'$  (1.702 m)     Head Circumference      Peak Flow      Pain Score 0     Pain Loc      Pain Edu?      Excl. in [place]?     Most recent vital signs: Vitals:   08/09/21 1033 08/09/21 1324  BP: 137/82 132/88  Pulse: 87 70  Resp: 18 18  Temp: 98.2 F (36.8 C) 98.5 F (36.9 C)  SpO2: 100% 99%     General: Awake, no distress.  CV:  Good peripheral perfusion.  Resp:  Normal effort.  Abd:  No distention.  Other:  Left lower extremity mild swelling.  No significant calf or popliteal tenderness.     ED Results / Procedures / Treatments   Labs (all labs ordered are listed, but only abnormal results are displayed) Labs Reviewed  BASIC METABOLIC PANEL - Abnormal; Notable for the following components:      Result Value   Potassium 5.2 (*)    Glucose, Bld 118 (*)    All other components within normal limits  CBC - Abnormal; Notable for the following components:   Hemoglobin 12.9 (*)    MCH 25.6 (*)    RDW 16.1 (*)     Platelets 403 (*)    All other components within normal limits  D-DIMER, QUANTITATIVE - Abnormal; Notable for the following components:   D-Dimer, Quant 2.98 (*)    All other components within normal limits     EKG     RADIOLOGY  US venous LLE: I independently viewed and interpreted the images; there is a left common femoral DVT.  Radiology read indicates that it is also involving the profunda femoral, femoral, popliteal, tibial veins.  PROCEDURES:  Critical Care performed: No  Procedures   MEDICATIONS ORDERED IN ED: Medications  apixaban (ELIQUIS) tablet 10 mg (10 mg Oral Given 08/09/21 1236)     IMPRESSION / MDM / ASSESSMENT AND PLAN / ED COURSE  I reviewed the triage vital signs and the nursing notes.  61 year old male with PMH as noted above is referred to the ED for treatment of DVT.  This was noted on an ultrasound performed this morning.  I  reviewed the images and report and he has a relative large but mostly nonocclusive DVT.  The patient has no other acute symptoms.  I reviewed the past medical records.  The patient was seen in podiatry with a left leg contusion last month.  I do not see any more recent visits related to the left leg.  Patient's presentation is most consistent with acute complicated illness / injury requiring diagnostic workup.  Basic labs are unremarkable except for elevated D-dimer which is consistent with the patient's DVT.  His potassium is borderline elevated but this is close to his baseline.  I consulted and discussed the case with Dr. Lucky Cowboy from vascular surgery who agrees with starting the patient on Eliquis.  He agrees to follow-up with the patient for evaluation of possible thrombectomy.  I counseled the patient on the results of the work-up and plan of care.  I have prescribed Eliquis.  I gave him referral to vascular surgery.  Return precautions given, the patient expresses understanding.   FINAL CLINICAL IMPRESSION(S) / ED  DIAGNOSES   Final diagnoses:  Acute deep vein thrombosis (DVT) of proximal vein of left lower extremity (HCC)     Rx / DC Orders   ED Discharge Orders          Ordered    APIXABAN (ELIQUIS) VTE STARTER PACK ('10MG'$  AND '5MG'$ )        08/09/21 1310             Note:  This document was prepared using Dragon voice recognition software and may include unintentional dictation errors.    Arta Silence, MD 08/09/21 [phone]

## 2021-08-09 NOTE — Telephone Encounter (Signed)
Pt aware and will go to ED. He verbalized undestanding

## 2021-08-09 NOTE — Discharge Instructions (Addendum)
Take the Eliquis as prescribed.  Make an appointment to follow-up with your regular doctor within 1 to 2 weeks so that you can be reassessed and continued on the Eliquis.  We have also given you referral to see the vascular surgeon as sometimes they may do a procedure to actually remove the clot.  However since her symptoms are mild you may not need this.  Return to the ER immediately for new, worsening, or persistent severe leg swelling or pain, difficulty breathing, chest pain, dizziness, or any other new or worsening symptoms that concern you.

## 2021-08-09 NOTE — ED Triage Notes (Signed)
Pt here after receiving a [place] of his left leg positive for a DVT. Pt states he was having leg pain 2 weeks ago and was able to get a US done this morning but currently is not having any pain. Pt ambulatory with slight limp.

## 2021-08-09 NOTE — Telephone Encounter (Signed)
'  Melvin Williams' with [facility] [place] calling with STAT preliminary report. Pt is positive for DVT, clot in left leg. Pt is being held until further disposition. NT called and spoke to [place].  CB# [phone]  'Melvin Williams' Please advise.

## 2021-08-14 ENCOUNTER — Telehealth: Payer: Self-pay | Admitting: Family Medicine

## 2021-08-14 NOTE — Telephone Encounter (Unsigned)
Copied from [place] [id number]. Topic: General - Other >> Aug 14, 2021  3:06 PM Tessa Lerner A wrote: Reason for CRM: The patient was previously prescribed APIXABAN (ELIQUIS) VTE STARTER PACK ('10MG'$  AND '5MG'$ ) [[id number]]  for their DVT concerns   The patient would like to know if they're supposed to continue taking aspirin as well   Please contact further when possible

## 2021-08-15 NOTE — Telephone Encounter (Signed)
Called patient and made aware. Pt gave verbal understanding.

## 2021-08-17 ENCOUNTER — Ambulatory Visit: Payer: BC Managed Care – PPO | Admitting: Internal Medicine

## 2021-08-17 ENCOUNTER — Encounter: Payer: Self-pay | Admitting: Internal Medicine

## 2021-08-17 VITALS — BP 118/80 | HR 96 | Resp 16 | Ht 67.0 in | Wt 229.0 lb

## 2021-08-17 DIAGNOSIS — I82412 Acute embolism and thrombosis of left femoral vein: Secondary | ICD-10-CM | POA: Diagnosis not present

## 2021-08-17 MED ORDER — APIXABAN 5 MG PO TABS: 5.0000 mg | ORAL_TABLET | Freq: Two times a day (BID) | ORAL | 2 refills | Status: DC

## 2021-08-17 NOTE — Progress Notes (Signed)
Acute Office Visit  Subjective:     Patient ID: Melvin Williams, male    DOB: Jan 17, 1961, 61 y.o.   MRN: [id number]  Chief Complaint  Patient presents with   Follow-up    DVT    HPI Patient is in today for follow up on recent DVT.  He was seen in the ER on 08/09/21 after complaining of left leg swelling over the previous week, however he states he had been having pain in his left lower extremity for the last few months.  He denies any provoking events, no long car rides, plane rides or recent surgeries or periods of immobilization.  He had an outpatient [place], which showed LLE DVT involving the common femoral, profunda femoral, femoral, popliteal and tibial veins. Thrombus mostly nonocclusive in the profunda femoral vein and essentially occlusive in the femoral vein. He has no previous history of DVT or PE. No family history of coagulation disorders. In the ER, labs showed D-dimer positive, potassium 5.2, hgb 12.9. The case was discussed with vascular surgery who recommended outpatient Eliquis and outpatient follow up. The patient does have a new patient visit with them next week.   Today the patient states that the redness, swelling and pain in the leg has decreased. He finished the Eliquis 10 mg BID on Tuesday and is now taking Eliquis 5 mg twice daily.  He is tolerating this medication well.  He denies any abnormal bleeding.  He denies melena or hematuria. He denies new leg pain, swelling, shortness of breath.    Review of Systems  Constitutional:  Negative for chills and fever.  Respiratory:  Negative for shortness of breath.   Cardiovascular:  Negative for chest pain and leg swelling.  Gastrointestinal:  Negative for blood in stool and melena.  Genitourinary:  Negative for hematuria.        Objective:    BP 118/80   Pulse 96   Resp 16   Ht '5\' 7"'$  (1.702 m)   Wt 229 lb (103.9 kg)   SpO2 98%   BMI 35.87 kg/m  BP Readings from Last 3 Encounters:  08/17/21 118/80  08/09/21  132/88  07/26/21 126/70   Wt Readings from Last 3 Encounters:  08/17/21 229 lb (103.9 kg)  08/09/21 229 lb 0.9 oz (103.9 kg)  07/26/21 229 lb (103.9 kg)      Physical Exam Constitutional:      Appearance: Normal appearance.  HENT:     Head: Normocephalic and atraumatic.  Eyes:     Conjunctiva/sclera: Conjunctivae normal.  Cardiovascular:     Rate and Rhythm: Normal rate and regular rhythm.  Pulmonary:     Effort: Pulmonary effort is normal.     Breath sounds: Normal breath sounds.  Musculoskeletal:        General: No tenderness.     Right lower leg: No edema.     Left lower leg: No edema.  Skin:    General: Skin is warm and dry.     Findings: No erythema.  Neurological:     General: No focal deficit present.     Mental Status: He is alert. Mental status is at baseline.  Psychiatric:        Mood and Affect: Mood normal.        Behavior: Behavior normal.     No results found for any visits on 08/17/21.      Assessment & Plan:   1. Acute deep vein thrombosis (DVT) of femoral vein of left  lower extremity ([place]): Diagnosed 08/09/21.  Completed Eliquis 10 mg twice daily dosing, now on Eliquis 5 mg twice daily and tolerating well, this will be refilled today.  As this is his first blood clot that appears to be unprovoked, plan to treat for total of 3 to 6 months.  He is following with vascular surgery on 08/25/2021.  In the meantime he is wearing compression stockings and keeping his legs elevated.  Follow-up scheduled here in October.  - apixaban (ELIQUIS) 5 MG TABS tablet; Take 1 tablet (5 mg total) by mouth 2 (two) times daily.  Dispense: 60 tablet; Refill: 2   Return for already scheduled .  Teodora Medici, DO

## 2021-08-17 NOTE — Patient Instructions (Addendum)
It was great seeing you today!  Plan discussed at today's visit: -Continue Eliquis 5 mg twice daily for another 3-6 months -Follow up with vascular surgery on 08/25/21 -Monitor for abnormal bleeding  Follow up in: October, already scheduled   Take care and let us know if you have any questions or concerns prior to your next visit.  Dr. Rosana Berger  Apixaban Tablets What is this medication? APIXABAN (a PIX a ban) prevents or treats blood clots. It is also used to lower the risk of stroke in people with AFib (atrial fibrillation). It belongs to a group of medications called blood thinners. This medicine may be used for other purposes; ask your health care provider or pharmacist if you have questions. COMMON BRAND NAME(S): Eliquis What should I tell my care team before I take this medication? They need to know if you have any of these conditions: Antiphospholipid antibody syndrome Bleeding disorder History of bleeding in the brain History of blood clots History of stomach bleeding Kidney disease Liver disease Mechanical heart valve Spinal surgery An unusual or allergic reaction to apixaban, other medications, foods, dyes, or preservatives Pregnant or trying to get pregnant Breast-feeding How should I use this medication? Take this medication by mouth. For your therapy to work as well as possible, take each dose exactly as prescribed on the prescription label. Do not skip doses. Skipping doses or stopping this medication can increase your risk of a blood clot or stroke. Keep taking this medication unless your care team tells you to stop. Take it as directed on the prescription label at the same time every day. You can take it with or without food. If it upsets your stomach, take it with food. A special MedGuide will be given to you by the pharmacist with each prescription and refill. Be sure to read this information carefully each time. Talk to your care team about the use of this  medication in children. Special care may be needed. Overdosage: If you think you have taken too much of this medicine contact a poison control center or emergency room at once. NOTE: This medicine is only for you. Do not share this medicine with others. What if I miss a dose? If you miss a dose, take it as soon as you can. If it is almost time for your next dose, take only that dose. Do not take double or extra doses. What may interact with this medication? This medication may interact with the following: Aspirin and aspirin-like medications Certain medications for fungal infections like itraconazole and ketoconazole Certain medications for seizures like carbamazepine and phenytoin Certain medications for blood clots like enoxaparin, dalteparin, heparin, and warfarin Clarithromycin NSAIDs, medications for pain and inflammation, like ibuprofen or naproxen Rifampin Ritonavir St. John's wort This list may not describe all possible interactions. Give your health care provider a list of all the medicines, herbs, non-prescription drugs, or dietary supplements you use. Also tell them if you smoke, drink alcohol, or use illegal drugs. Some items may interact with your medicine. What should I watch for while using this medication? Visit your healthcare professional for regular checks on your progress. You may need blood work done while you are taking this medication. Your condition will be monitored carefully while you are receiving this medication. It is important not to miss any appointments. Avoid sports and activities that might cause injury while you are using this medication. Severe falls or injuries can cause unseen bleeding. Be careful when using sharp tools or knives. Consider  using an Copy. Take special care brushing or flossing your teeth. Report any injuries, bruising, or red spots on the skin to your healthcare professional. If you are going to need surgery or other procedure, tell  your healthcare professional that you are taking this medication. Wear a medical ID bracelet or chain. Carry a card that describes your disease and details of your medication and dosage times. What side effects may I notice from receiving this medication? Side effects that you should report to your care team as soon as possible: Allergic reactions--skin rash, itching, hives, swelling of the face, lips, tongue, or throat Bleeding--bloody or black, tar-like stools, vomiting blood or brown material that looks like coffee grounds, red or dark brown urine, small red or purple spots on the skin, unusual bruising or bleeding Bleeding in the brain--severe headache, stiff neck, confusion, dizziness, change in vision, numbness or weakness of the face, arm, or leg, trouble speaking, trouble walking, vomiting Heavy periods This list may not describe all possible side effects. Call your doctor for medical advice about side effects. You may report side effects to FDA at 1-800-FDA-1088. Where should I keep my medication? Keep out of the reach of children and pets. Store at room temperature between 20 and 25 degrees C (68 and 77 degrees F). Get rid of any unused medication after the expiration date. To get rid of medications that are no longer needed or expired: Take the medication to a medication take-back program. Check with your pharmacy or law enforcement to find a location. If you cannot return the medication, check the label or package insert to see if the medication should be thrown out in the garbage or flushed down the toilet. If you are not sure, ask your care team. If it is safe to put in the trash, empty the medication out of the container. Mix the medication with cat litter, dirt, coffee grounds, or other unwanted substance. Seal the mixture in a bag or container. Put it in the trash. NOTE: This sheet is a summary. It may not cover all possible information. If you have questions about this medicine, talk  to your doctor, pharmacist, or health care provider.  2023 Elsevier/Gold Standard (2020-03-25 00:00:00)

## 2021-08-25 ENCOUNTER — Encounter (INDEPENDENT_AMBULATORY_CARE_PROVIDER_SITE_OTHER): Payer: Self-pay | Admitting: Vascular Surgery

## 2021-09-05 ENCOUNTER — Ambulatory Visit (INDEPENDENT_AMBULATORY_CARE_PROVIDER_SITE_OTHER): Payer: BC Managed Care – PPO | Admitting: Vascular Surgery

## 2021-09-05 ENCOUNTER — Encounter (INDEPENDENT_AMBULATORY_CARE_PROVIDER_SITE_OTHER): Payer: Self-pay | Admitting: Vascular Surgery

## 2021-09-05 VITALS — BP 125/79 | HR 97 | Resp 18 | Ht 67.0 in | Wt 227.0 lb

## 2021-09-05 DIAGNOSIS — I82412 Acute embolism and thrombosis of left femoral vein: Secondary | ICD-10-CM | POA: Diagnosis not present

## 2021-09-05 DIAGNOSIS — R7303 Prediabetes: Secondary | ICD-10-CM | POA: Diagnosis not present

## 2021-09-05 DIAGNOSIS — E782 Mixed hyperlipidemia: Secondary | ICD-10-CM | POA: Diagnosis not present

## 2021-09-05 DIAGNOSIS — I10 Essential (primary) hypertension: Secondary | ICD-10-CM

## 2021-09-05 NOTE — Progress Notes (Signed)
Patient ID: Melvin Williams, male   DOB: November 24, 1960, 61 y.o.   MRN: [id number]  Chief Complaint  Patient presents with   [place] Patient (Initial Visit)    Consult  on DVT    HPI Melvin Williams is a 61 y.o. male.  I am asked to see the patient by Dr. Ancil Boozer for evaluation of left lower extremity DVT.  The patient has a history of a DVT in that left leg about 2 years ago.  About a month ago, he was noted to have largely nonocclusive thrombus in the left lower extremity with some areas of occlusive thrombus in the femoral vein and profunda femoris vein.  At that time, is associated with some increased swelling and heaviness in the leg.  He is started on anticoagulation and the swelling at this point is essentially gone.  He is wearing compression socks which is also helping the swelling.  He denies any open wounds or infection.  No chest pain or shortness of breath.  He has had no bleeding issues on the anticoagulants other than some mild hematuria when he passed a kidney stone in the past.     Past Medical History:  Diagnosis Date   GERD (gastroesophageal reflux disease)    Hyperlipidemia    Hypertension     No past surgical history on file.   Family History  Problem Relation Age of Onset   Breast cancer Mother    Liver cancer Mother    Heart disease Father    Heart attack Father    Heart attack Paternal Grandfather       Social History   Tobacco Use   Smoking status: Never   Smokeless tobacco: Never  Vaping Use   Vaping Use: Never used  Substance Use Topics   Alcohol use: No   Drug use: No     No Known Allergies  Current Outpatient Medications  Medication Sig Dispense Refill   apixaban (ELIQUIS) 5 MG TABS tablet Take 1 tablet (5 mg total) by mouth 2 (two) times daily. 60 tablet 2   Cholecalciferol (VITAMIN D) 50 MCG (2000 UT) CAPS Take 1 capsule by mouth daily.     lisinopril (ZESTRIL) 10 MG tablet TAKE 1 TABLET(10 MG) BY MOUTH DAILY 90 tablet  1   tamsulosin (FLOMAX) 0.4 MG CAPS capsule TAKE 1 CAPSULE(0.4 MG) BY MOUTH DAILY 90 capsule 1   No current facility-administered medications for this visit.      REVIEW OF SYSTEMS (Negative unless checked)  Constitutional: '[]'$ Weight loss  '[]'$ Fever  '[]'$ Chills Cardiac: '[]'$ Chest pain   '[]'$ Chest pressure   '[]'$ Palpitations   '[]'$ Shortness of breath when laying flat   '[]'$ Shortness of breath at rest   '[]'$ Shortness of breath with exertion. Vascular:  '[]'$ Pain in legs with walking   '[]'$ Pain in legs at rest   '[]'$ Pain in legs when laying flat   '[]'$ Claudication   '[]'$ Pain in feet when walking  '[]'$ Pain in feet at rest  '[]'$ Pain in feet when laying flat   '[]'$ History of DVT   '[x]'$ Phlebitis   '[x]'$ Swelling in legs   '[]'$ Varicose veins   '[]'$ Non-healing ulcers Pulmonary:   '[]'$ Uses home oxygen   '[]'$ Productive cough   '[]'$ Hemoptysis   '[]'$ Wheeze  '[]'$ COPD   '[]'$ Asthma Neurologic:  '[]'$ Dizziness  '[]'$ Blackouts   '[]'$ Seizures   '[]'$ History of stroke   '[]'$ History of TIA  '[]'$ Aphasia   '[]'$ Temporary blindness   '[]'$ Dysphagia   '[]'$ Weakness or numbness in arms   '[]'$ Weakness  or numbness in legs Musculoskeletal:  '[x]'$ Arthritis   '[]'$ Joint swelling   '[]'$ Joint pain   '[]'$ Low back pain Hematologic:  '[]'$ Easy bruising  '[]'$ Easy bleeding   '[]'$ Hypercoagulable state   '[]'$ Anemic  '[]'$ Hepatitis Gastrointestinal:  '[]'$ Blood in stool   '[]'$ Vomiting blood  '[]'$ Gastroesophageal reflux/heartburn   '[]'$ Abdominal pain Genitourinary:  '[]'$ Chronic kidney disease   '[]'$ Difficult urination  '[]'$ Frequent urination  '[]'$ Burning with urination   '[x]'$ Hematuria Skin:  '[]'$ Rashes   '[]'$ Ulcers   '[]'$ Wounds Psychological:  '[]'$ History of anxiety   '[]'$  History of major depression.    Physical Exam BP 125/79 (BP Location: Left Arm)   Pulse 97   Resp 18   Ht '5\' 7"'$  (1.702 m)   Wt 227 lb (103 kg)   BMI 35.55 kg/m  Gen:  WD/WN, NAD Head: [place]/AT, No temporalis wasting. Ear/Nose/Throat: Hearing grossly intact, nares w/o erythema or drainage, oropharynx w/o Erythema/Exudate Eyes: Conjunctiva clear, sclera non-icteric  Neck: trachea  midline.  No JVD.  Pulmonary:  Good air movement, respirations not labored, no use of accessory muscles  Cardiac: RRR, no JVD Vascular:  Vessel Right Left  Radial Palpable Palpable                                   Gastrointestinal:. No masses, surgical incisions, or scars. Musculoskeletal: M/S 5/5 throughout.  Extremities without ischemic changes.  No deformity or atrophy.  No appreciable lower extremity edema. Neurologic: Sensation grossly intact in extremities.  Symmetrical.  Speech is fluent. Motor exam as listed above. Psychiatric: Judgment intact, Mood & affect appropriate for pt's clinical situation. Dermatologic: No rashes or ulcers noted.  No cellulitis or open wounds.    Radiology US Venous Img Lower Unilateral Left  Result Date: 08/09/2021 CLINICAL DATA:  Left lower extremity edema. EXAM: LEFT LOWER EXTREMITY VENOUS DOPPLER ULTRASOUND TECHNIQUE: Gray-scale sonography with graded compression, as well as color Doppler and duplex ultrasound were performed to evaluate the lower extremity deep venous systems from the level of the common femoral vein and including the common femoral, femoral, profunda femoral, popliteal and calf veins including the posterior tibial, peroneal and gastrocnemius veins when visible. The superficial great saphenous vein was also interrogated. Spectral Doppler was utilized to evaluate flow at rest and with distal augmentation maneuvers in the common femoral, femoral and popliteal veins. COMPARISON:  None Available. FINDINGS: Contralateral Common Femoral Vein: Respiratory phasicity is normal and symmetric with the symptomatic side. No evidence of thrombus. Normal compressibility. Common Femoral Vein: Nonocclusive thrombus present primarily below the saphenofemoral junction. Saphenofemoral Junction: The saphenofemoral junction is patent. Profunda Femoral Vein: Occlusive thrombus present. Femoral Vein: Essentially occlusive thrombus present throughout the  femoral vein with a small amount of flow detected. Popliteal Vein: Nonocclusive thrombus present with a small amount of flow detected including inflow from the short saphenous vein. Calf Veins: Nonocclusive thrombus present in the visualized posterior tibial and peroneal veins. Superficial Great Saphenous Vein: No evidence of thrombus. Normal compressibility. Venous Reflux:  None. Other Findings: No evidence of superficial thrombophlebitis or abnormal fluid collection. IMPRESSION: Positive for fairly extensive left lower extremity DVT involving the common femoral, profunda femoral, femoral, popliteal and tibial veins. Thrombus is mostly nonocclusive but occlusive in the profunda femoral vein and essentially occlusive in the femoral vein. Electronically Signed   By: Aletta Edouard M.D.   On: 08/09/2021 10:07    Labs Recent Results (from the past 2160 hour(s))  Basic metabolic panel  Status: Abnormal   Collection Time: 08/09/21 10:38 AM  Result Value Ref Range   Sodium 140 135 - 145 mmol/L   Potassium 5.2 (H) 3.5 - 5.1 mmol/L   Chloride 105 98 - 111 mmol/L   CO2 27 22 - 32 mmol/L   Glucose, Bld 118 (H) 70 - 99 mg/dL    Comment: Glucose reference range applies only to samples taken after fasting for at least 8 hours.   BUN 19 8 - 23 mg/dL   Creatinine, Ser 0.78 0.61 - 1.24 mg/dL   Calcium 9.6 8.9 - 10.3 mg/dL   GFR, Estimated >60 >60 mL/min    Comment: (NOTE) Calculated using the CKD-EPI Creatinine Equation (2021)    Anion gap 8 5 - 15    Comment: Performed at [facility], [address] [place], [place] [address]  CBC     Status: Abnormal   Collection Time: 08/09/21 10:38 AM  Result Value Ref Range   WBC 8.5 4.0 - 10.5 K/uL   RBC 5.04 4.22 - 5.81 MIL/uL   Hemoglobin 12.9 (L) 13.0 - 17.0 g/dL   HCT 41.9 39.0 - 52.0 %   MCV 83.1 80.0 - 100.0 fL   MCH 25.6 (L) 26.0 - 34.0 pg   MCHC 30.8 30.0 - 36.0 g/dL   RDW 16.1 (H) 11.5 - 15.5 %   Platelets 403 (H) 150 - 400 K/uL    nRBC 0.0 0.0 - 0.2 %    Comment: Performed at [facility], [address] [place], [place] [address]  D-dimer, quantitative     Status: Abnormal   Collection Time: 08/09/21 10:38 AM  Result Value Ref Range   D-Dimer, Quant 2.98 (H) 0.00 - 0.50 ug/mL-FEU    Comment: (NOTE) At the manufacturer cut-off value of 0.5 g/mL FEU, this assay has a negative predictive value of 95-100%.This assay is intended for use in conjunction with a clinical pretest probability (PTP) assessment model to exclude pulmonary embolism (PE) and deep venous thrombosis (DVT) in outpatients suspected of PE or DVT. Results should be correlated with clinical presentation. Performed at [facility], [address] [place], [place] [address]     Assessment/Plan:  DVT (deep venous thrombosis) ([place]) About a month ago, he was noted to have largely nonocclusive thrombus in the left lower extremity with some areas of occlusive thrombus in the femoral vein and profunda femoris vein.  The patient likely has a significant component of chronic DVT in that leg with some acute component which has responded appropriately to anticoagulation and compression over the last few weeks.  His symptoms are not significant at this point and no intervention will be planned.  I would continue anticoagulation for a minimum of 6 months and given his second essentially unprovoked DVT, I would definitely consider lifelong anticoagulation on this patient.  I will plan on seeing him back in 6 months.  Hypertension, benign blood pressure control important in reducing the progression of atherosclerotic disease. On appropriate oral medications.   Hyperlipidemia lipid control important in reducing the progression of atherosclerotic disease.    Pre-diabetes blood glucose control important in reducing the progression of atherosclerotic disease. Also, involved in wound healing. On appropriate medications.      Leotis Pain 09/06/2021, 9:49 AM   This note was created with Dragon medical transcription system.  Any errors from dictation are unintentional.

## 2021-09-06 DIAGNOSIS — I82409 Acute embolism and thrombosis of unspecified deep veins of unspecified lower extremity: Secondary | ICD-10-CM | POA: Insufficient documentation

## 2021-09-06 NOTE — Assessment & Plan Note (Signed)
lipid control important in reducing the progression of atherosclerotic disease.   

## 2021-09-06 NOTE — Patient Instructions (Signed)
Deep Vein Thrombosis  Deep vein thrombosis (DVT) is a condition in which a blood clot forms in a vein of the deep venous system. This can occur in the lower leg, thigh, pelvis, arm, or neck. A clot is blood that has thickened into a gel or solid. This condition is serious and can be life-threatening if the clot travels to the arteries of the lungs and causes a blockage (pulmonary embolism). A DVT can also damage veins in the leg, which can lead to long-term venous disease, leg pain, swelling, discoloration, and ulcers or sores (post-thrombotic syndrome). What are the causes? This condition may be caused by: A slowdown of blood flow. Damage to a vein. A condition that causes blood to clot more easily, such as certain bleeding disorders. What increases the risk? The following factors may make you more likely to develop this condition: Obesity. Being older, especially older than age 3. Being inactive or not moving around (sedentary lifestyle). This may include: Sitting or lying down for longer than 4-6 hours other than to sleep at night. Being in the hospital, or having major or lengthy surgery. Having any recent bone injuries, such as breaks (fractures), that reduce movement, especially in the lower extremities. Having recent orthopedic surgery on the lower extremities. Being pregnant, giving birth, or having recently given birth. Taking medicines that contain estrogen, such as birth control or hormone replacement therapy. Using products that contain nicotine or tobacco, especially if you use hormonal birth control. Having a history of a blood vessel disease (peripheral vascular disease) or congestive heart disease. Having a history of cancer, especially if being treated with chemotherapy. What are the signs or symptoms? Symptoms of this condition include: Swelling, pain, pressure, or tenderness in an arm or a leg. An arm or a leg becoming warm, red, or discolored. A leg turning very pale or  blue. You may have a large DVT. This is rare. If the clot is in your leg, you may notice that symptoms get worse when you stand or walk. In some cases, there are no symptoms. How is this diagnosed? This condition is diagnosed with: Your medical history and a physical exam. Tests, such as: Blood tests to check how well your blood clots. Doppler ultrasound. This is the best way to find a DVT. CT venogram. Contrast dye is injected into a vein, and X-rays are taken to check for clots. This is helpful for veins in the chest or pelvis. How is this treated? Treatment for this condition depends on: The cause of your DVT. The size and location of your DVT, or having more than one DVT. Your risk for bleeding or developing more clots. Other medical conditions you may have. Treatment may include: Taking a blood thinner medicine (anticoagulant) to prevent more clots from forming or current clots from growing. Wearing compression stockings. Injecting medicines into the affected vein to break up the clot (catheter-directed thrombolysis). Surgical procedures, when DVT is severe or hard to treat. These may be done to: Isolate and remove your clot. Place an inferior vena cava (IVC) filter. This filter is placed into a large vein called the inferior vena cava to catch blood clots before they reach your lungs. You may get some medical treatments for 6 months or longer. Follow these instructions at home: If you are taking blood thinners: Talk with your health care provider before you take any medicines that contain aspirin or NSAIDs, such as ibuprofen. These medicines increase your risk for dangerous bleeding. Take your medicine exactly  as told, at the same time every day. Do not skip a dose. Do not take more than the prescribed dose. This is important. Ask your health care provider about foods and medicines that could change or interact with the way your blood thinner works. Avoid these foods and medicines  if you are told to do so. Avoid anything that may cause bleeding or bruising. You may bleed more easily while taking blood thinners. Be very careful when using knives, scissors, or other sharp objects. Use an electric razor instead of a blade. Avoid activities that could cause injury or bruising, and follow instructions for preventing falls. Tell your health care provider if you have had any internal bleeding, bleeding ulcers, or neurologic diseases, such as strokes or cerebral aneurysms. Wear a medical alert bracelet or carry a card that lists what medicines you take. General instructions Take over-the-counter and prescription medicines only as told by your health care provider. Return to your normal activities as told by your health care provider. Ask your health care provider what activities are safe for you. If recommended, wear compression stockings as told by your health care provider. These stockings help to prevent blood clots and reduce swelling in your legs. Never wear your compression stockings while sleeping at night. Keep all follow-up visits. This is important. Where to find more information American Heart Association: www.heart.org Centers for Disease Control and Prevention: http://www.wolf.info/ National Heart, Lung, and Blood Institute: https://wilson-eaton.com/ Contact a health care provider if: You miss a dose of your blood thinner. You have unusual bruising or other color changes. You have new or worse pain, swelling, or redness in an arm or a leg. You have worsening numbness or tingling in an arm or a leg. You have a significant color change (pale or blue) in the extremity that has the DVT. Get help right away if: You have signs or symptoms that a blood clot has moved to the lungs. These may include: Shortness of breath. Chest pain. Fast or irregular heartbeats (palpitations). Light-headedness, dizziness, or fainting. Coughing up blood. You have signs or symptoms that your blood is  too thin. These may include: Blood in your vomit, stool, or urine. A cut that will not stop bleeding. A menstrual period that is heavier than usual. A severe headache or confusion. These symptoms may be an emergency. Get help right away. Call 911. Do not wait to see if the symptoms will go away. Do not drive yourself to the hospital. Summary Deep vein thrombosis (DVT) happens when a blood clot forms in a deep vein. This may occur in the lower leg, thigh, pelvis, arm, or neck. Symptoms affect the arm or leg and can include swelling, pain, tenderness, warmth, redness, or discoloration. This condition may be treated with medicines. In severe cases, a procedure or surgery may be done to remove or dissolve the clots. If you are taking blood thinners, take them exactly as told. Do not skip a dose. Do not take more than is prescribed. Get help right away if you have a severe headache, shortness of breath, chest pain, fast or irregular heartbeats, or blood in your vomit, urine, or stool. This information is not intended to replace advice given to you by your health care provider. Make sure you discuss any questions you have with your health care provider. Document Revised: 09/19/2020 Document Reviewed: 09/19/2020 Elsevier Patient Education  [place].

## 2021-09-06 NOTE — Assessment & Plan Note (Signed)
About a month ago, he was noted to have largely nonocclusive thrombus in the left lower extremity with some areas of occlusive thrombus in the femoral vein and profunda femoris vein.  The patient likely has a significant component of chronic DVT in that leg with some acute component which has responded appropriately to anticoagulation and compression over the last few weeks.  His symptoms are not significant at this point and no intervention will be planned.  I would continue anticoagulation for a minimum of 6 months and given his second essentially unprovoked DVT, I would definitely consider lifelong anticoagulation on this patient.  I will plan on seeing him back in 6 months.

## 2021-09-06 NOTE — Assessment & Plan Note (Signed)
blood glucose control important in reducing the progression of atherosclerotic disease. Also, involved in wound healing. On appropriate medications.  

## 2021-09-06 NOTE — Assessment & Plan Note (Signed)
blood pressure control important in reducing the progression of atherosclerotic disease. On appropriate oral medications.  

## 2021-12-02 ENCOUNTER — Other Ambulatory Visit: Payer: Self-pay | Admitting: Internal Medicine

## 2021-12-02 DIAGNOSIS — I82412 Acute embolism and thrombosis of left femoral vein: Secondary | ICD-10-CM

## 2021-12-04 NOTE — Telephone Encounter (Signed)
Requested Prescriptions  Pending Prescriptions Disp Refills  . ELIQUIS 5 MG TABS tablet [Pharmacy Med Name: ELIQUIS '5MG'$  TABLETS] 60 tablet 2    Sig: TAKE 1 TABLET(5 MG) BY MOUTH TWICE DAILY     Hematology:  Anticoagulants - apixaban Failed - 12/02/2021 10:43 AM      Failed - PLT in normal range and within 360 days    Platelets  Date Value Ref Range Status  08/09/2021 403 (H) 150 - 400 K/uL Final         Failed - HGB in normal range and within 360 days    Hemoglobin  Date Value Ref Range Status  08/09/2021 12.9 (L) 13.0 - 17.0 g/dL Final         Passed - HCT in normal range and within 360 days    HCT  Date Value Ref Range Status  08/09/2021 41.9 39.0 - 52.0 % Final         Passed - Cr in normal range and within 360 days    Creat  Date Value Ref Range Status  12/22/2020 0.83 0.70 - 1.35 mg/dL Final   Creatinine, Ser  Date Value Ref Range Status  08/09/2021 0.78 0.61 - 1.24 mg/dL Final   Creatinine, Urine  Date Value Ref Range Status  12/22/2020 138 20 - 320 mg/dL Final         Passed - AST in normal range and within 360 days    AST  Date Value Ref Range Status  12/22/2020 21 10 - 35 U/L Final         Passed - ALT in normal range and within 360 days    ALT  Date Value Ref Range Status  12/22/2020 39 9 - 46 U/L Final         Passed - Valid encounter within last 12 months    Recent Outpatient Visits          3 months ago Acute deep vein thrombosis (DVT) of femoral vein of left lower extremity [facility])   Villal[place], DO   4 months ago Left leg swelling   [facility], Dani Gobble, PA-C   5 months ago Colon cancer screening   [facility] Steele Sizer, MD   11 months ago Morbid obesity [facility])   [facility] Steele Sizer, MD   1 year ago Upper respiratory tract infection due to COVID-19 virus   [facility] Delsa Grana, [place]      Future  Appointments            In 2 weeks Steele Sizer, MD [facility], [place]   In 1 month Steele Sizer, MD [facility], [facility]

## 2021-12-20 NOTE — Progress Notes (Signed)
Name: Melvin Williams   MRN: [id number]    DOB: 02/16/1961   Date:12/21/2021       Progress Note  Subjective  Chief Complaint  Follow Up  HPI  HTN: he had lost weight on his last visit and was off lisinopril for a while, however he started to gain weight and BP went up again, he has been back on lisinopril, denies side effects and bp is at goal  No chest pain, palpitation or SOB.   BPH: he feels like medication has helped with symptoms and wants to continue flomax, last PSA improved, he is due for repeat PSA but wants to wait until next month when he comes in for his physical   History of kidney stones: no recent episodes of hematuria, he passed one stone in 2020, he sees Urologist   Cervical radiculitis: he had MRI back in 2008 , seen neurosurgeon but since symptoms have been mild surgery was not indicated, he continues to have some paresthesias on right arm when abducting it. He states also has some hand weakness intermittently but is chronic and stable.   Hyperlipidemia: he has not been taking medications, we will recheck labs during his CPE next month and discuss medication if needed  The 10-year ASCVD risk score (Arnett DK, et al., 2019) is: 8.9%   Values used to calculate the score:     Age: 61 years     Sex: Male     Is Non-Hispanic African American: No     Diabetic: No     Tobacco smoker: No     Systolic Blood Pressure: 725 mmHg     Is BP treated: Yes     HDL Cholesterol: 51 mg/dL     Total Cholesterol: 164 mg/dL   Morbid Obesity BMI above 35 with pre-diabetes and HTN, dyslipidemia. He states he likes to eat . Weight is up again, he knows he needs to increase regular physical activity   Chronic left lower extremity DVT :seen by Dr. Lucky Cowboy, unprovoked, diagnosed this Summer, he will follow up with Dr. Lucky Cowboy in January for repeat doppler, he denies any calf pain or swelling , he is taking Eliquis and denies side effects   Cholelithiasis: diagnosed many years ago, controlled with  diet , no recent episodes of pain lately but he has intermittent diarrhea   Pre-diabetes: last A1C was down from 6.2 % to 6.1 %  he denies polyphagia, polydipsia, he has urinary frequency but since he was in his 30's and is unchanged   Patient Active Problem List   Diagnosis Date Noted   DVT (deep venous thrombosis) ([place]) 09/06/2021   Morbid obesity, unspecified obesity type ([place]) 10/17/2017   Benign prostatic hyperplasia without lower urinary tract symptoms 04/11/2016   PSA elevation 04/11/2016   Pre-diabetes 04/11/2016   Hypertension, benign 10/10/2015   Hyperlipidemia 10/10/2015   Hyperglycemia 10/10/2015   Vitamin D deficiency 10/10/2015   Microalbuminuria 10/10/2015   Eczema 10/10/2015   Seborrhea 10/10/2015   Fatty liver 10/10/2015   Cholelithiasis 10/10/2015   Cervical disc disease 10/10/2015    No past surgical history on file.  Family History  Problem Relation Age of Onset   Breast cancer Mother    Liver cancer Mother    Heart disease Father    Heart attack Father    Heart attack Paternal Grandfather     Social History   Tobacco Use   Smoking status: Never   Smokeless tobacco: Never  Substance Use Topics  Alcohol use: No     Current Outpatient Medications:    Cholecalciferol (VITAMIN D) 50 MCG (2000 UT) CAPS, Take 1 capsule by mouth daily., Disp: , Rfl:    ELIQUIS 5 MG TABS tablet, TAKE 1 TABLET(5 MG) BY MOUTH TWICE DAILY, Disp: 60 tablet, Rfl: 2   lisinopril (ZESTRIL) 10 MG tablet, TAKE 1 TABLET(10 MG) BY MOUTH DAILY, Disp: 90 tablet, Rfl: 1   tamsulosin (FLOMAX) 0.4 MG CAPS capsule, TAKE 1 CAPSULE(0.4 MG) BY MOUTH DAILY, Disp: 90 capsule, Rfl: 1  No Known Allergies  I personally reviewed active problem list, medication list, allergies, family history, social history, health maintenance with the patient/caregiver today.   ROS  Ten systems reviewed and is negative except as mentioned in HPI   Objective  Vitals:   12/21/21 0733  BP: 120/72   Pulse: 94  Resp: 16  SpO2: 97%  Weight: 233 lb (105.7 kg)  Height: '5\' 7"'$  (1.702 m)    Body mass index is 36.49 kg/m.  Physical Exam  Constitutional: Patient appears well-developed and well-nourished. Obese  No distress.  HEENT: head atraumatic, normocephalic, pupils equal and reactive to light, neck supple Cardiovascular: Normal rate, regular rhythm and normal heart sounds.  No murmur heard. No BLE edema. Pulmonary/Chest: Effort normal and breath sounds normal. No respiratory distress. Abdominal: Soft.  There is no tenderness. Psychiatric: Patient has a normal mood and affect. behavior is normal. Judgment and thought content normal.    PHQ2/9:    12/21/2021    7:32 AM 08/17/2021    7:51 AM 07/26/2021    8:42 AM 06/15/2021    7:34 AM 12/22/2020    7:32 AM  Depression screen PHQ 2/9  Decreased Interest 1 0 0 0 0  Down, Depressed, Hopeless 0 0 0 0 0  PHQ - 2 Score 1 0 0 0 0  Altered sleeping 0   0   Tired, decreased energy 0   0   Change in appetite 0   0   Feeling bad or failure about yourself  0   0   Trouble concentrating 0   0   Moving slowly or fidgety/restless 0   0   Suicidal thoughts 0   0   PHQ-9 Score 1   0     phq 9 is negative   Fall Risk:    12/21/2021    7:32 AM 08/17/2021    7:51 AM 07/26/2021    8:41 AM 06/15/2021    7:34 AM 12/22/2020    7:31 AM  Fall Risk   Falls in the past year? 0 0 0 0 0  Number falls in past yr: 0 0 0 0 0  Injury with Fall? 0 0 0 0 0  Risk for fall due to : No Fall Risks No Fall Risks No Fall Risks No Fall Risks No Fall Risks  Follow up Falls prevention discussed Falls prevention discussed Falls prevention discussed Falls prevention discussed Falls prevention discussed      Functional Status Survey: Is the patient deaf or have difficulty hearing?: No Does the patient have difficulty seeing, even when wearing glasses/contacts?: No Does the patient have difficulty concentrating, remembering, or making decisions?: Yes Does the  patient have difficulty walking or climbing stairs?: No Does the patient have difficulty dressing or bathing?: No Does the patient have difficulty doing errands alone such as visiting a doctor's office or shopping?: No    Assessment & Plan  1. Leg DVT (deep venous thromboembolism), chronic, left (  [place])  - apixaban (ELIQUIS) 5 MG TABS tablet; Take 1 tablet (5 mg total) by mouth 2 (two) times daily.  Dispense: 180 tablet; Refill: 0  2. Hypertension, benign  - lisinopril (ZESTRIL) 10 MG tablet; TAKE 1 TABLET(10 MG) BY MOUTH DAILY  Dispense: 90 tablet; Refill: 1  3. Morbid obesity ([place])  Discussed with the patient the risk posed by an increased BMI. Discussed importance of portion control, calorie counting and at least 150 minutes of physical activity weekly. Avoid sweet beverages and drink more water. Eat at least 6 servings of fruit and vegetables daily    4. Prediabetes   5. Mixed hyperlipidemia   6. Vitamin D deficiency   7. Benign prostatic hyperplasia with lower urinary tract symptoms, symptom details unspecified  - tamsulosin (FLOMAX) 0.4 MG CAPS capsule; TAKE 1 CAPSULE(0.4 MG) BY MOUTH DAILY  Dispense: 90 capsule; Refill: 1  8. Need for immunization against influenza  - Flu Vaccine QUAD 78moIM (Fluarix, Fluzone & Alfiuria Quad PF)  9. Colon cancer screening  - Fecal Globin By Immunochemistry

## 2021-12-21 ENCOUNTER — Encounter: Payer: Self-pay | Admitting: Family Medicine

## 2021-12-21 ENCOUNTER — Ambulatory Visit: Payer: BC Managed Care – PPO | Admitting: Family Medicine

## 2021-12-21 VITALS — BP 120/72 | HR 94 | Resp 16 | Ht 67.0 in | Wt 233.0 lb

## 2021-12-21 DIAGNOSIS — R7303 Prediabetes: Secondary | ICD-10-CM

## 2021-12-21 DIAGNOSIS — E559 Vitamin D deficiency, unspecified: Secondary | ICD-10-CM

## 2021-12-21 DIAGNOSIS — Z23 Encounter for immunization: Secondary | ICD-10-CM

## 2021-12-21 DIAGNOSIS — E782 Mixed hyperlipidemia: Secondary | ICD-10-CM

## 2021-12-21 DIAGNOSIS — Z1211 Encounter for screening for malignant neoplasm of colon: Secondary | ICD-10-CM

## 2021-12-21 DIAGNOSIS — I10 Essential (primary) hypertension: Secondary | ICD-10-CM | POA: Diagnosis not present

## 2021-12-21 DIAGNOSIS — N401 Enlarged prostate with lower urinary tract symptoms: Secondary | ICD-10-CM

## 2021-12-21 DIAGNOSIS — I82502 Chronic embolism and thrombosis of unspecified deep veins of left lower extremity: Secondary | ICD-10-CM | POA: Diagnosis not present

## 2021-12-21 MED ORDER — TAMSULOSIN HCL 0.4 MG PO CAPS: ORAL_CAPSULE | ORAL | 1 refills | Status: DC

## 2021-12-21 MED ORDER — APIXABAN 5 MG PO TABS: 5.0000 mg | ORAL_TABLET | Freq: Two times a day (BID) | ORAL | 0 refills | Status: DC

## 2021-12-21 MED ORDER — LISINOPRIL 10 MG PO TABS: ORAL_TABLET | ORAL | 1 refills | Status: DC

## 2022-01-08 ENCOUNTER — Encounter (INDEPENDENT_AMBULATORY_CARE_PROVIDER_SITE_OTHER): Payer: Self-pay

## 2022-01-17 NOTE — Patient Instructions (Signed)

## 2022-01-17 NOTE — Progress Notes (Unsigned)
Name: Melvin Williams   MRN: [id number]    DOB: 1961/02/24   Date:01/18/2022       Progress Note  Subjective  Chief Complaint  Annual Exam  HPI  Patient presents for annual CPE.  IPSS Questionnaire (AUA-7): Over the past month.   1)  How often have you had a sensation of not emptying your bladder completely after you finish urinating?  0 - Not at all  2)  How often have you had to urinate again less than two hours after you finished urinating? 5 - Almost always  3)  How often have you found you stopped and started again several times when you urinated?  0 - Not at all  4) How difficult have you found it to postpone urination?  0 - Not at all  5) How often have you had a weak urinary stream?  3 - About half the time  6) How often have you had to push or strain to begin urination?  0 - Not at all  7) How many times did you most typically get up to urinate from the time you went to bed until the time you got up in the morning?  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  Last Dental Exam: never seen by a dentist  Last Eye Exam: due for another eye exam   Depression: phq 9 is positive - he states he has always felt that way     01/18/2022    7:28 AM 12/21/2021    7:32 AM 08/17/2021    7:51 AM 07/26/2021    8:42 AM 06/15/2021    7:34 AM  Depression screen PHQ 2/9  Decreased Interest 1 1 0 0 0  Down, Depressed, Hopeless 1 0 0 0 0  PHQ - 2 Score 2 1 0 0 0  Altered sleeping 0 0   0  Tired, decreased energy 3 0   0  Change in appetite 0 0   0  Feeling bad or failure about yourself  0 0   0  Trouble concentrating 3 0   0  Moving slowly or fidgety/restless 0 0   0  Suicidal thoughts 0 0   0  PHQ-9 Score 8 1   0    Hypertension:  BP Readings  from Last 3 Encounters:  01/18/22 118/72  12/21/21 120/72  09/05/21 125/79    Obesity: Wt Readings from Last 3 Encounters:  01/18/22 233 lb (105.7 kg)  12/21/21 233 lb (105.7 kg)  09/05/21 227 lb (103 kg)   BMI Readings from Last 3 Encounters:  01/18/22 36.49 kg/m  12/21/21 36.49 kg/m  09/05/21 35.55 kg/m     Lipids:  Lab Results  Component Value Date   CHOL 164 12/22/2020   CHOL 185 12/16/2019   CHOL 180 12/15/2018   Lab Results  Component Value Date   HDL 51 12/22/2020   HDL 57 12/16/2019   HDL 60 12/15/2018   Lab Results  Component Value Date   LDLCALC 93 12/22/2020   LDLCALC 103 (H) 12/16/2019   LDLCALC 97 12/15/2018   Lab Results  Component Value Date   TRIG 102 12/22/2020   TRIG 151 (  H) 12/16/2019   TRIG 136 12/15/2018   Lab Results  Component Value Date   CHOLHDL 3.2 12/22/2020   CHOLHDL 3.2 12/16/2019   CHOLHDL 3.0 12/15/2018   No results found for: "LDLDIRECT" Glucose:  Glucose, Bld  Date Value Ref Range Status  08/09/2021 118 (H) 70 - 99 mg/dL Final    Comment:    Glucose reference range applies only to samples taken after fasting for at least 8 hours.  12/22/2020 99 65 - 99 mg/dL Final    Comment:    .            Fasting reference interval .   12/16/2019 91 65 - 99 mg/dL Final    Comment:    .            Fasting reference interval .     Flowsheet Row Video [facility]/06/[facility]  AUDIT-C Score 0      Single STD testing and prevention (HIV/chl/gon/syphilis): N/A Sexual history: not sexually active  Hep C Screening: 10/11/15 Skin cancer: Discussed monitoring for atypical lesions Colorectal cancer: 07/20/10  Prostate cancer:   Lab Results  Component Value Date   PSA 4.50 (H) 03/17/2021   PSA 5.39 (H) 12/22/2020   PSA 3.43 12/16/2019     Lung cancer:  Low Dose CT Chest recommended if Age 61-80 years, 30 pack-year currently smoking OR have quit w/in 15years. Patient  no a candidate for  screening   AAA: The USPSTF recommends one-time screening with ultrasonography in men ages 14 to 1 years who have ever smoked. Patient   no, a candidate for screening  ECG:  10/11/16  Vaccines:   RSV vaccine: discussed to get at pharmacy  Tdap: up to date Shingrix: up to date  Pneumonia: N/A Flu: up to date COVID-42: discussed booster   Advanced Care Planning: A voluntary discussion about advance care planning including the explanation and discussion of advance directives.  Discussed health care proxy and Living will, and the patient was able to identify a health care proxy as sister .  Patient does not have a living will and power of attorney of health care   Patient Active Problem List   Diagnosis Date Noted   DVT (deep venous thrombosis) ([place]) 09/06/2021   Morbid obesity, unspecified obesity type ([place]) 10/17/2017   Benign prostatic hyperplasia without lower urinary tract symptoms 04/11/2016   PSA elevation 04/11/2016   Pre-diabetes 04/11/2016   Hypertension, benign 10/10/2015   Hyperlipidemia 10/10/2015   Hyperglycemia 10/10/2015   Vitamin D deficiency 10/10/2015   Microalbuminuria 10/10/2015   Eczema 10/10/2015   Seborrhea 10/10/2015   Fatty liver 10/10/2015   Cholelithiasis 10/10/2015   Cervical disc disease 10/10/2015    No past surgical history on file.  Family History  Problem Relation Age of Onset   Breast cancer Mother    Liver cancer Mother    Heart disease Father    Heart attack Father    Heart attack Paternal Grandfather     Social History   Socioeconomic History   Marital status: Single    Spouse name: Not on file   Number of children: 0   Years of education: Not on file   Highest education level: High school graduate  Occupational History   Occupation: Cabin crew   Tobacco Use   Smoking status: Never   Smokeless tobacco: Never  Vaping Use   Vaping Use: Never used  Substance and Sexual Activity   Alcohol use:  No   Drug use: No    Sexual activity: Never  Other Topics Concern   Not on file  Social History Narrative   Promoted to transportation supervisor    Social Determinants of Health   Financial Resource Strain: Low Risk  (01/18/2022)   Overall Financial Resource Strain (CARDIA)    Difficulty of Paying Living Expenses: Not hard at all  Food Insecurity: No Food Insecurity (01/18/2022)   Hunger Vital Sign    Worried About Running Out of Food in the Last Year: Never true    Ran Out of Food in the Last Year: Never true  Transportation Needs: No Transportation Needs (01/18/2022)   PRAPARE - Hydrologist (Medical): No    Lack of Transportation (Non-Medical): No  Physical Activity: Sufficiently Active (01/18/2022)   Exercise Vital Sign    Days of Exercise per Week: 5 days    Minutes of Exercise per Session: 60 min  Stress: Stress Concern Present (01/18/2022)   [place]    Feeling of Stress : Rather much  Social Connections: Unknown (01/18/2022)   Social Connection and Isolation Panel [NHANES]    Frequency of Communication with Friends and Family: Patient refused    Frequency of Social Gatherings with Friends and Family: Patient refused    Attends Religious Services: Patient refused    Marine scientist or Organizations: No    Attends Archivist Meetings: Never    Marital Status: Never married  Intimate Partner Violence: Not At Risk (01/18/2022)   Humiliation, Afraid, Rape, and Kick questionnaire    Fear of Current or Ex-Partner: No    Emotionally Abused: No    Physically Abused: No    Sexually Abused: No     Current Outpatient Medications:    apixaban (ELIQUIS) 5 MG TABS tablet, Take 1 tablet (5 mg total) by mouth 2 (two) times daily., Disp: 180 tablet, Rfl: 0   Cholecalciferol (VITAMIN D) 50 MCG (2000 UT) CAPS, Take 1 capsule by mouth daily., Disp: , Rfl:    lisinopril (ZESTRIL) 10 MG tablet,  TAKE 1 TABLET(10 MG) BY MOUTH DAILY, Disp: 90 tablet, Rfl: 1   tamsulosin (FLOMAX) 0.4 MG CAPS capsule, TAKE 1 CAPSULE(0.4 MG) BY MOUTH DAILY, Disp: 90 capsule, Rfl: 1  No Known Allergies   ROS  Constitutional: Negative for fever or weight change.  Respiratory: Negative for cough and shortness of breath.   Cardiovascular: Negative for chest pain or palpitations.  Gastrointestinal: Negative for abdominal pain, no bowel changes. - he has intermittent diarrhea  Musculoskeletal: Negative for gait problem or joint swelling. He has radiculitis down right hand intermittent  Skin: positive for mild rash - seborrhea  Breast: he noticed a lump on his right breast  Neurological: Negative for dizziness or headache.  No other specific complaints in a complete review of systems (except as listed in HPI above).    Objective  Vitals:   01/18/22 0729  BP: 118/72  Pulse: 94  Resp: 16  SpO2: 96%  Weight: 233 lb (105.7 kg)  Height: '5\' 7"'$  (1.702 m)    Body mass index is 36.49 kg/m.  Physical Exam  Constitutional: Patient appears well-developed and well-nourished. No distress.  HENT: Head: Normocephalic and atraumatic. Ears: B TMs ok, no erythema or effusion; Nose: Nose normal. Mouth/Throat: Oropharynx is clear and moist. No oropharyngeal exudate.  Eyes: Conjunctivae and EOM are normal. Pupils are equal, round, and reactive to light.  No scleral icterus.  Neck: Normal range of motion. Neck supple. No JVD present. No thyromegaly present.  Cardiovascular: Normal rate, regular rhythm and normal heart sounds.  No murmur heard. No BLE edema. Pulmonary/Chest: Effort normal and breath sounds normal. No respiratory distress. Abdominal: Soft. Bowel sounds are normal, no distension. There is no tenderness. no masses MALE GENITALIA: refused he said he will go to urologist if needed  RECTAL: refused  Musculoskeletal: Normal range of motion, no joint effusions. No gross deformities. Pain with rom of neck   Neurological: he is alert and oriented to person, place, and time. No cranial nerve deficit. Coordination, balance, strength, speech and gait are normal.  Skin: Skin is warm and dry. Some seborrhea near scalp  Psychiatric: Patient has a normal mood and affect. behavior is normal. Judgment and thought content normal.    Fall Risk:    01/18/2022    7:28 AM 12/21/2021    7:32 AM 08/17/2021    7:51 AM 07/26/2021    8:41 AM 06/15/2021    7:34 AM  Fall Risk   Falls in the past year? 0 0 0 0 0  Number falls in past yr: 0 0 0 0 0  Injury with Fall? 0 0 0 0 0  Risk for fall due to : No Fall Risks No Fall Risks No Fall Risks No Fall Risks No Fall Risks  Follow up Falls prevention discussed Falls prevention discussed Falls prevention discussed Falls prevention discussed Falls prevention discussed     Functional Status Survey: Is the patient deaf or have difficulty hearing?: No Does the patient have difficulty seeing, even when wearing glasses/contacts?: No Does the patient have difficulty concentrating, remembering, or making decisions?: Yes Does the patient have difficulty walking or climbing stairs?: No Does the patient have difficulty dressing or bathing?: No Does the patient have difficulty doing errands alone such as visiting a doctor's office or shopping?: No    Assessment & Plan  1. Well adult exam  - CBC with Differential/Platelet - COMPLETE METABOLIC PANEL WITH GFR - Lipid panel - PSA - Hemoglobin A1c - Fecal Globin By Immunochemistry  2. Breast lump on right side at 11 o'clock position  We will order mammogram and [place]   3. Family history of breast cancer in mother   84. Prediabetes  - Hemoglobin A1c  5. Vitamin D deficiency  Taking supplementation   6. Hypertension, benign  - COMPLETE METABOLIC PANEL WITH GFR  7. Benign prostatic hyperplasia with lower urinary tract symptoms, symptom details unspecified   8. Elevated PSA  - PSA  9. History of anemia  -  CBC with Differential/Platelet  10. Colon cancer screening  - Fecal Globin By Immunochemistry  11. Lipid screening  - Lipid panel     -Prostate cancer screening and PSA options (with potential risks and benefits of testing vs not testing) were discussed along with recent recs/guidelines. -USPSTF grade A and B recommendations reviewed with patient; age-appropriate recommendations, preventive care, screening tests, etc discussed and encouraged; healthy living encouraged; see AVS for patient education given to patient -Discussed importance of 150 minutes of physical activity weekly, eat two servings of fish weekly, eat one serving of tree nuts ( cashews, pistachios, pecans, almonds.Marland Kitchen) every other day, eat 6 servings of fruit/vegetables daily and drink plenty of water and avoid sweet beverages.  -Reviewed Health Maintenance: yes

## 2022-01-18 ENCOUNTER — Ambulatory Visit (INDEPENDENT_AMBULATORY_CARE_PROVIDER_SITE_OTHER): Payer: BC Managed Care – PPO | Admitting: Family Medicine

## 2022-01-18 ENCOUNTER — Encounter: Payer: Self-pay | Admitting: Family Medicine

## 2022-01-18 VITALS — BP 118/72 | HR 94 | Resp 16 | Ht 67.0 in | Wt 233.0 lb

## 2022-01-18 DIAGNOSIS — I10 Essential (primary) hypertension: Secondary | ICD-10-CM | POA: Diagnosis not present

## 2022-01-18 DIAGNOSIS — Z1211 Encounter for screening for malignant neoplasm of colon: Secondary | ICD-10-CM

## 2022-01-18 DIAGNOSIS — Z0000 Encounter for general adult medical examination without abnormal findings: Secondary | ICD-10-CM | POA: Diagnosis not present

## 2022-01-18 DIAGNOSIS — R7303 Prediabetes: Secondary | ICD-10-CM | POA: Diagnosis not present

## 2022-01-18 DIAGNOSIS — Z862 Personal history of diseases of the blood and blood-forming organs and certain disorders involving the immune mechanism: Secondary | ICD-10-CM

## 2022-01-18 DIAGNOSIS — N6311 Unspecified lump in the right breast, upper outer quadrant: Secondary | ICD-10-CM | POA: Diagnosis not present

## 2022-01-18 DIAGNOSIS — Z803 Family history of malignant neoplasm of breast: Secondary | ICD-10-CM

## 2022-01-18 DIAGNOSIS — R972 Elevated prostate specific antigen [PSA]: Secondary | ICD-10-CM

## 2022-01-18 DIAGNOSIS — Z13220 Encounter for screening for lipoid disorders: Secondary | ICD-10-CM | POA: Diagnosis not present

## 2022-01-18 DIAGNOSIS — E559 Vitamin D deficiency, unspecified: Secondary | ICD-10-CM

## 2022-01-18 DIAGNOSIS — N401 Enlarged prostate with lower urinary tract symptoms: Secondary | ICD-10-CM

## 2022-01-19 LAB — CBC WITH DIFFERENTIAL/PLATELET
Absolute Monocytes: 374 cells/uL (ref 200–950)
Basophils Absolute: 41 cells/uL (ref 0–200)
Basophils Relative: 0.6 %
Eosinophils Absolute: 61 cells/uL (ref 15–500)
Eosinophils Relative: 0.9 %
HCT: 40.6 % (ref 38.5–50.0)
Hemoglobin: 13.5 g/dL (ref 13.2–17.1)
Lymphs Abs: 1646 cells/uL (ref 850–3900)
MCH: 27.6 pg (ref 27.0–33.0)
MCHC: 33.3 g/dL (ref 32.0–36.0)
MCV: 82.9 fL (ref 80.0–100.0)
MPV: 10.4 fL (ref 7.5–12.5)
Monocytes Relative: 5.5 %
Neutro Abs: 4678 cells/uL (ref 1500–7800)
Neutrophils Relative %: 68.8 %
Platelets: 339 10*3/uL (ref 140–400)
RBC: 4.9 10*6/uL (ref 4.20–5.80)
RDW: 15.6 % — ABNORMAL HIGH (ref 11.0–15.0)
Total Lymphocyte: 24.2 %
WBC: 6.8 10*3/uL (ref 3.8–10.8)

## 2022-01-19 LAB — COMPLETE METABOLIC PANEL WITH GFR
AG Ratio: 1.6 (calc) (ref 1.0–2.5)
ALT: 21 U/L (ref 9–46)
AST: 16 U/L (ref 10–35)
Albumin: 4.5 g/dL (ref 3.6–5.1)
Alkaline phosphatase (APISO): 60 U/L (ref 35–144)
BUN: 17 mg/dL (ref 7–25)
CO2: 24 mmol/L (ref 20–32)
Calcium: 9.7 mg/dL (ref 8.6–10.3)
Chloride: 108 mmol/L (ref 98–110)
Creat: 0.76 mg/dL (ref 0.70–1.35)
Globulin: 2.8 g/dL (calc) (ref 1.9–3.7)
Glucose, Bld: 102 mg/dL — ABNORMAL HIGH (ref 65–99)
Potassium: 5.2 mmol/L (ref 3.5–5.3)
Sodium: 140 mmol/L (ref 135–146)
Total Bilirubin: 0.7 mg/dL (ref 0.2–1.2)
Total Protein: 7.3 g/dL (ref 6.1–8.1)
eGFR: 102 mL/min/{1.73_m2} (ref >=60)

## 2022-01-19 LAB — HEMOGLOBIN A1C
Hgb A1c MFr Bld: 6.4 % of total Hgb — ABNORMAL HIGH (ref <5.7)
Mean Plasma Glucose: 137 mg/dL
eAG (mmol/L): 7.6 mmol/L

## 2022-01-19 LAB — LIPID PANEL
Cholesterol: 202 mg/dL — ABNORMAL HIGH (ref <200)
HDL: 61 mg/dL (ref >=40)
LDL Cholesterol (Calc): 113 mg/dL (calc) — ABNORMAL HIGH
Non-HDL Cholesterol (Calc): 141 mg/dL (calc) — ABNORMAL HIGH (ref <130)
Total CHOL/HDL Ratio: 3.3 (calc) (ref <5.0)
Triglycerides: 166 mg/dL — ABNORMAL HIGH (ref <150)

## 2022-01-19 LAB — PSA: PSA: 4.61 ng/mL — ABNORMAL HIGH (ref <=4.00)

## 2022-02-07 ENCOUNTER — Ambulatory Visit
Admission: RE | Admit: 2022-02-07 | Discharge: 2022-02-07 | Disposition: A | Discharge status: Home / Self Care | Payer: BC Managed Care – PPO | Source: Ambulatory Visit | Attending: Family Medicine | Admitting: Family Medicine

## 2022-02-07 DIAGNOSIS — N6311 Unspecified lump in the right breast, upper outer quadrant: Secondary | ICD-10-CM | POA: Insufficient documentation

## 2022-02-07 DIAGNOSIS — Z803 Family history of malignant neoplasm of breast: Secondary | ICD-10-CM | POA: Insufficient documentation

## 2022-02-07 DIAGNOSIS — R92313 Mammographic fatty tissue density, bilateral breasts: Secondary | ICD-10-CM | POA: Diagnosis not present

## 2022-02-08 ENCOUNTER — Other Ambulatory Visit: Payer: Self-pay

## 2022-02-08 DIAGNOSIS — N6001 Solitary cyst of right breast: Secondary | ICD-10-CM

## 2022-03-06 ENCOUNTER — Encounter (INDEPENDENT_AMBULATORY_CARE_PROVIDER_SITE_OTHER): Payer: BC Managed Care – PPO

## 2022-03-06 ENCOUNTER — Ambulatory Visit (INDEPENDENT_AMBULATORY_CARE_PROVIDER_SITE_OTHER): Payer: BC Managed Care – PPO | Admitting: Vascular Surgery

## 2022-03-08 ENCOUNTER — Other Ambulatory Visit: Payer: Self-pay | Admitting: Internal Medicine

## 2022-03-08 DIAGNOSIS — I82502 Chronic embolism and thrombosis of unspecified deep veins of left lower extremity: Secondary | ICD-10-CM

## 2022-03-09 NOTE — Telephone Encounter (Signed)
Requested Prescriptions  Pending Prescriptions Disp Refills   ELIQUIS 5 MG TABS tablet [Pharmacy Med Name: [id number] '5MG'$  TABLETS] 180 tablet 1    Sig: TAKE 1 TABLET(5 MG) BY MOUTH TWICE DAILY     Hematology:  Anticoagulants - apixaban Passed - 03/08/2022  3:13 AM      Passed - PLT in normal range and within 360 days    Platelets  Date Value Ref Range Status  01/18/2022 339 140 - 400 Thousand/uL Final         Passed - HGB in normal range and within 360 days    Hemoglobin  Date Value Ref Range Status  01/18/2022 13.5 13.2 - 17.1 g/dL Final         Passed - HCT in normal range and within 360 days    HCT  Date Value Ref Range Status  01/18/2022 40.6 38.5 - 50.0 % Final         Passed - Cr in normal range and within 360 days    Creat  Date Value Ref Range Status  01/18/2022 0.76 0.70 - 1.35 mg/dL Final   Creatinine, Urine  Date Value Ref Range Status  12/22/2020 138 20 - 320 mg/dL Final         Passed - AST in normal range and within 360 days    AST  Date Value Ref Range Status  01/18/2022 16 10 - 35 U/L Final         Passed - ALT in normal range and within 360 days    ALT  Date Value Ref Range Status  01/18/2022 21 9 - 46 U/L Final         Passed - Valid encounter within last 12 months    Recent Outpatient Visits           1 month ago Well adult exam   [facility] [place], Drue Stager, MD   2 months ago Leg DVT (deep venous thromboembolism), chronic, left [facility])   [facility] Steele Sizer, MD   6 months ago Acute deep vein thrombosis (DVT) of femoral vein of left lower extremity [facility])   [place], DO   7 months ago Left leg swelling   [facility], Dani Gobble, PA-C   8 months ago Colon cancer screening   [facility] Steele Sizer, MD       Future Appointments             In 3 months Ancil Boozer, Drue Stager, MD [facility],  [facility]

## 2022-03-22 ENCOUNTER — Other Ambulatory Visit (INDEPENDENT_AMBULATORY_CARE_PROVIDER_SITE_OTHER): Payer: Self-pay | Admitting: Vascular Surgery

## 2022-03-22 DIAGNOSIS — I82412 Acute embolism and thrombosis of left femoral vein: Secondary | ICD-10-CM

## 2022-03-30 ENCOUNTER — Ambulatory Visit (INDEPENDENT_AMBULATORY_CARE_PROVIDER_SITE_OTHER): Payer: BC Managed Care – PPO

## 2022-03-30 ENCOUNTER — Ambulatory Visit (INDEPENDENT_AMBULATORY_CARE_PROVIDER_SITE_OTHER): Payer: BC Managed Care – PPO | Admitting: Vascular Surgery

## 2022-03-30 ENCOUNTER — Encounter (INDEPENDENT_AMBULATORY_CARE_PROVIDER_SITE_OTHER): Payer: Self-pay | Admitting: Vascular Surgery

## 2022-03-30 ENCOUNTER — Encounter (INDEPENDENT_AMBULATORY_CARE_PROVIDER_SITE_OTHER): Payer: BC Managed Care – PPO

## 2022-03-30 VITALS — BP 119/80 | HR 72 | Resp 16 | Wt 220.8 lb

## 2022-03-30 DIAGNOSIS — R7303 Prediabetes: Secondary | ICD-10-CM

## 2022-03-30 DIAGNOSIS — I10 Essential (primary) hypertension: Secondary | ICD-10-CM | POA: Diagnosis not present

## 2022-03-30 DIAGNOSIS — I82412 Acute embolism and thrombosis of left femoral vein: Secondary | ICD-10-CM

## 2022-03-30 DIAGNOSIS — E782 Mixed hyperlipidemia: Secondary | ICD-10-CM

## 2022-03-30 MED ORDER — APIXABAN 5 MG PO TABS: 5.0000 mg | ORAL_TABLET | Freq: Two times a day (BID) | ORAL | 11 refills | Status: DC

## 2022-03-30 NOTE — Assessment & Plan Note (Signed)
A follow-up duplex was performed today which showed only a small amount of chronic appearing thrombus in the left femoral vein proved from his previous study. He is doing well clinically We had a long discussion today regarding his treatment options.  Given the fact that he has had multiple DVTs most recent was unprovoked, he will need some sort of anticoagulation going forward.  Eliquis of 5 mg twice daily is working well for him.  Another option would be Eliquis at 2.5 mg twice daily which be a prophylactic dose.  He would prefer the full dose as he is doing well with that and has not had any recurrent thrombus on that and I think that is very reasonable.  I have sent in a prescription for 5 mg twice daily of Eliquis with a year of refills.  At this point, I can see him back as needed.

## 2022-03-30 NOTE — Progress Notes (Signed)
MRN : [id number]  Melvin Williams is a 62 y.o. ([birth year]) male who presents with chief complaint of  Chief Complaint  Patient presents with   Follow-up    Ultrasound follow up  .  History of Present Illness: Patient returns today in follow up of his DVT.  He is doing fairly well.  He has had 2 episodes of bleeding from hematuria with kidney stones while on anticoagulation but these were both fairly mild.  He says he had actually had some bleeding prior to being on anticoagulation before when he had kidney stones this is not alarming to him.  Otherwise, no issues with his anticoagulation.  He is tolerating 5 mg of Eliquis twice daily.  He is about 7 months status post diagnosis with a recurrent left lower extremity DVT that was unprovoked.  A follow-up duplex was performed today which showed only a small amount of chronic appearing thrombus in the left femoral vein proved from his previous study.  Current Outpatient Medications  Medication Sig Dispense Refill   apixaban (ELIQUIS) 5 MG TABS tablet TAKE 1 TABLET(5 MG) BY MOUTH TWICE DAILY 180 tablet 1   apixaban (ELIQUIS) 5 MG TABS tablet Take 1 tablet (5 mg total) by mouth 2 (two) times daily. 60 tablet 11   Cholecalciferol (VITAMIN D) 50 MCG (2000 UT) CAPS Take 1 capsule by mouth daily.     lisinopril (ZESTRIL) 10 MG tablet TAKE 1 TABLET(10 MG) BY MOUTH DAILY 90 tablet 1   tamsulosin (FLOMAX) 0.4 MG CAPS capsule TAKE 1 CAPSULE(0.4 MG) BY MOUTH DAILY 90 capsule 1   No current facility-administered medications for this visit.    Past Medical History:  Diagnosis Date   GERD (gastroesophageal reflux disease)    Hyperlipidemia    Hypertension     No past surgical history on file.   Social History   Tobacco Use   Smoking status: Never   Smokeless tobacco: Never  Vaping Use   Vaping Use: Never used  Substance Use Topics   Alcohol use: No   Drug use: No      Family History  Problem Relation Age of Onset   Breast cancer  Mother    Liver cancer Mother    Heart disease Father    Heart attack Father    Heart attack Paternal Grandfather      No Known Allergies  REVIEW OF SYSTEMS (Negative unless checked)   Constitutional: '[]'$ Weight loss  '[]'$ Fever  '[]'$ Chills Cardiac: '[]'$ Chest pain   '[]'$ Chest pressure   '[]'$ Palpitations   '[]'$ Shortness of breath when laying flat   '[]'$ Shortness of breath at rest   '[]'$ Shortness of breath with exertion. Vascular:  '[]'$ Pain in legs with walking   '[]'$ Pain in legs at rest   '[]'$ Pain in legs when laying flat   '[]'$ Claudication   '[]'$ Pain in feet when walking  '[]'$ Pain in feet at rest  '[]'$ Pain in feet when laying flat   '[]'$ History of DVT   '[x]'$ Phlebitis   '[x]'$ Swelling in legs   '[]'$ Varicose veins   '[]'$ Non-healing ulcers Pulmonary:   '[]'$ Uses home oxygen   '[]'$ Productive cough   '[]'$ Hemoptysis   '[]'$ Wheeze  '[]'$ COPD   '[]'$ Asthma Neurologic:  '[]'$ Dizziness  '[]'$ Blackouts   '[]'$ Seizures   '[]'$ History of stroke   '[]'$ History of TIA  '[]'$ Aphasia   '[]'$ Temporary blindness   '[]'$ Dysphagia   '[]'$ Weakness or numbness in arms   '[]'$ Weakness or numbness in legs Musculoskeletal:  '[x]'$ Arthritis   '[]'$ Joint swelling   '[]'$ Joint pain   '[]'$ Low back  pain Hematologic:  '[]'$ Easy bruising  '[]'$ Easy bleeding   '[]'$ Hypercoagulable state   '[]'$ Anemic  '[]'$ Hepatitis Gastrointestinal:  '[]'$ Blood in stool   '[]'$ Vomiting blood  '[]'$ Gastroesophageal reflux/heartburn   '[]'$ Abdominal pain Genitourinary:  '[]'$ Chronic kidney disease   '[]'$ Difficult urination  '[]'$ Frequent urination  '[]'$ Burning with urination   '[x]'$ Hematuria Skin:  '[]'$ Rashes   '[]'$ Ulcers   '[]'$ Wounds Psychological:  '[]'$ History of anxiety   '[]'$  History of major depression.  Physical Examination  BP 119/80 (BP Location: Left Arm)   Pulse 72   Resp 16   Wt 220 lb 12.8 oz (100.2 kg)   BMI 34.58 kg/m  Gen:  WD/WN, NAD Head: [place]/AT, No temporalis wasting. Ear/Nose/Throat: Hearing grossly intact, nares w/o erythema or drainage Eyes: Conjunctiva clear. Sclera non-icteric Neck: Supple.  Trachea midline Pulmonary:  Good air movement, no use of  accessory muscles.  Cardiac: RRR, no JVD Vascular:  Vessel Right Left  Radial Palpable Palpable                          PT Palpable Palpable  DP Palpable Palpable   Gastrointestinal: soft, non-tender/non-distended. No guarding/reflex.  Musculoskeletal: M/S 5/5 throughout.  No deformity or atrophy. Trace LLE edema. Neurologic: Sensation grossly intact in extremities.  Symmetrical.  Speech is fluent.  Psychiatric: Judgment intact, Mood & affect appropriate for pt's clinical situation. Dermatologic: No rashes or ulcers noted.  No cellulitis or open wounds.      Labs Recent Results (from the past 2160 hour(s))  CBC with Differential/Platelet     Status: Abnormal   Collection Time: 01/18/22  8:19 AM  Result Value Ref Range   WBC 6.8 3.8 - 10.8 Thousand/uL   RBC 4.90 4.20 - 5.80 Million/uL   Hemoglobin 13.5 13.2 - 17.1 g/dL   HCT 40.6 38.5 - 50.0 %   MCV 82.9 80.0 - 100.0 fL   MCH 27.6 27.0 - 33.0 pg   MCHC 33.3 32.0 - 36.0 g/dL   RDW 15.6 (H) 11.0 - 15.0 %   Platelets 339 140 - 400 Thousand/uL   MPV 10.4 7.5 - 12.5 fL   Neutro Abs 4,678 1,500 - 7,800 cells/uL   Lymphs Abs 1,646 850 - 3,900 cells/uL   Absolute Monocytes 374 200 - 950 cells/uL   Eosinophils Absolute 61 15 - 500 cells/uL   Basophils Absolute 41 0 - 200 cells/uL   Neutrophils Relative % 68.8 %   Total Lymphocyte 24.2 %   Monocytes Relative 5.5 %   Eosinophils Relative 0.9 %   Basophils Relative 0.6 %  COMPLETE METABOLIC PANEL WITH GFR     Status: Abnormal   Collection Time: 01/18/22  8:19 AM  Result Value Ref Range   Glucose, Bld 102 (H) 65 - 99 mg/dL    Comment: .            Fasting reference interval . For someone without known diabetes, a glucose value between 100 and 125 mg/dL is consistent with prediabetes and should be confirmed with a follow-up test. .    BUN 17 7 - 25 mg/dL   Creat 0.76 0.70 - 1.35 mg/dL   eGFR 102 > OR = 60 mL/min/1.85m   BUN/Creatinine Ratio SEE NOTE: 6 - 22 (calc)     Comment:    Not Reported: BUN and Creatinine are within    reference range. .    Sodium 140 135 - 146 mmol/L   Potassium 5.2 3.5 - 5.3 mmol/L   Chloride 108 98 -  110 mmol/L   CO2 24 20 - 32 mmol/L   Calcium 9.7 8.6 - 10.3 mg/dL   Total Protein 7.3 6.1 - 8.1 g/dL   Albumin 4.5 3.6 - 5.1 g/dL   Globulin 2.8 1.9 - 3.7 g/dL (calc)   AG Ratio 1.6 1.0 - 2.5 (calc)   Total Bilirubin 0.7 0.2 - 1.2 mg/dL   Alkaline phosphatase (APISO) 60 35 - 144 U/L   AST 16 10 - 35 U/L   ALT 21 9 - 46 U/L  Lipid panel     Status: Abnormal   Collection Time: 01/18/22  8:19 AM  Result Value Ref Range   Cholesterol 202 (H) <200 mg/dL   HDL 61 > OR = 40 mg/dL   Triglycerides 166 (H) <150 mg/dL   LDL Cholesterol (Calc) 113 (H) mg/dL (calc)    Comment: Reference range: <100 . Desirable range <100 mg/dL for primary prevention;   <70 mg/dL for patients with CHD or diabetic patients  with > or = 2 CHD risk factors. Marland Kitchen LDL-C is now calculated using the Martin-Hopkins  calculation, which is a validated novel method providing  better accuracy than the Friedewald equation in the  estimation of LDL-C.  Cresenciano Genre et al. Annamaria Helling. 7253;664(40): 2061-2068  (http://education.[facility].com/faq/FAQ164)    Total CHOL/HDL Ratio 3.3 <5.0 (calc)   Non-HDL Cholesterol (Calc) 141 (H) <130 mg/dL (calc)    Comment: For patients with diabetes plus 1 major ASCVD risk  factor, treating to a non-HDL-C goal of <100 mg/dL  (LDL-C of <70 mg/dL) is considered a therapeutic  option.   PSA     Status: Abnormal   Collection Time: 01/18/22  8:19 AM  Result Value Ref Range   PSA 4.61 (H) < OR = 4.00 ng/mL    Comment: The total PSA value from this assay system is  standardized against the WHO standard. The test  result will be approximately 20% lower when compared  to the equimolar-standardized total PSA (Beckman  Coulter). Comparison of serial PSA results should be  interpreted with this fact in mind. . This test was  performed using the Siemens  chemiluminescent method. Values obtained from  different assay methods cannot be used interchangeably. PSA levels, regardless of value, should not be interpreted as absolute evidence of the presence or absence of disease.   Hemoglobin A1c     Status: Abnormal   Collection Time: 01/18/22  8:19 AM  Result Value Ref Range   Hgb A1c MFr Bld 6.4 (H) <5.7 % of total Hgb    Comment: For someone without known diabetes, a hemoglobin  A1c value between 5.7% and 6.4% is consistent with prediabetes and should be confirmed with a  follow-up test. . For someone with known diabetes, a value <7% indicates that their diabetes is well controlled. A1c targets should be individualized based on duration of diabetes, age, comorbid conditions, and other considerations. . This assay result is consistent with an increased risk of diabetes. . Currently, no consensus exists regarding use of hemoglobin A1c for diagnosis of diabetes for children. .    Mean Plasma Glucose 137 mg/dL   eAG (mmol/L) 7.6 mmol/L    Radiology No results found.  Assessment/Plan Hypertension, benign blood pressure control important in reducing the progression of atherosclerotic disease. On appropriate oral medications.     Hyperlipidemia lipid control important in reducing the progression of atherosclerotic disease.      Pre-diabetes blood glucose control important in reducing the progression of atherosclerotic disease. Also, involved in  wound healing. On appropriate medications.  DVT (deep venous thrombosis) (HCC) A follow-up duplex was performed today which showed only a small amount of chronic appearing thrombus in the left femoral vein proved from his previous study. He is doing well clinically We had a long discussion today regarding his treatment options.  Given the fact that he has had multiple DVTs most recent was unprovoked, he will need some sort of anticoagulation going forward.   Eliquis of 5 mg twice daily is working well for him.  Another option would be Eliquis at 2.5 mg twice daily which be a prophylactic dose.  He would prefer the full dose as he is doing well with that and has not had any recurrent thrombus on that and I think that is very reasonable.  I have sent in a prescription for 5 mg twice daily of Eliquis with a year of refills.  At this point, I can see him back as needed.    Leotis Pain, MD  03/30/2022 10:06 AM    This note was created with Dragon medical transcription system.  Any errors from dictation are purely unintentional

## 2022-04-26 ENCOUNTER — Ambulatory Visit: Payer: Self-pay | Admitting: *Deleted

## 2022-04-26 NOTE — Telephone Encounter (Signed)
Spoke to patient and he states he was not having pain it was more of a "twitch" feeling like he had pulled a muscle. Pt states he work lifting heavy at work may could of been the cause, as of today it is not hurting he verbalized understanding if he does have pain or get worst to seek medical attention ASAP.

## 2022-04-26 NOTE — Telephone Encounter (Signed)
Reason for Disposition  Difficulty breathing  Answer Assessment - Initial Assessment Questions 1. LOCATION: "Where does it hurt?"       Left part of chest 2. RADIATION: "Does the pain go anywhere else?" (e.g., into neck, jaw, arms, back)     no 3. ONSET: "When did the chest pain begin?" (Minutes, hours or days)      Last night- yesterday 4. PATTERN: "Does the pain come and go, or has it been constant since it started?"  "Does it get worse with exertion?"      Comes and goes- yes 5. DURATION: "How long does it last" (e.g., seconds, minutes, hours)     *No Answer* 6. SEVERITY: "How bad is the pain?"  (e.g., Scale 1-10; mild, moderate, or severe)    - MILD (1-3): doesn't interfere with normal activities     - MODERATE (4-7): interferes with normal activities or awakens from sleep    - SEVERE (8-10): excruciating pain, unable to do any normal activities       mild 7. CARDIAC RISK FACTORS: "Do you have any history of heart problems or risk factors for heart disease?" (e.g., angina, prior heart attack; diabetes, high blood pressure, high cholesterol, smoker, or strong family history of heart disease)     High BP, over weight, family hx 8. PULMONARY RISK FACTORS: "Do you have any history of lung disease?"  (e.g., blood clots in lung, asthma, emphysema, birth control pills)     DVT 9. CAUSE: "What do you think is causing the chest pain?"     Possible pulled muscle 10. OTHER SYMPTOMS: "Do you have any other symptoms?" (e.g., dizziness, nausea, vomiting, sweating, fever, difficulty breathing, cough)       dizziness  Protocols used: Chest Pain-A-AH

## 2022-05-25 ENCOUNTER — Other Ambulatory Visit: Payer: Self-pay | Admitting: Family Medicine

## 2022-05-25 DIAGNOSIS — I10 Essential (primary) hypertension: Secondary | ICD-10-CM

## 2022-06-02 ENCOUNTER — Other Ambulatory Visit: Payer: Self-pay | Admitting: Family Medicine

## 2022-06-02 DIAGNOSIS — I82502 Chronic embolism and thrombosis of unspecified deep veins of left lower extremity: Secondary | ICD-10-CM

## 2022-06-14 ENCOUNTER — Ambulatory Visit: Payer: BC Managed Care – PPO | Admitting: Family Medicine

## 2022-07-22 ENCOUNTER — Other Ambulatory Visit: Payer: Self-pay | Admitting: Family Medicine

## 2022-07-22 DIAGNOSIS — N401 Enlarged prostate with lower urinary tract symptoms: Secondary | ICD-10-CM

## 2022-07-23 ENCOUNTER — Other Ambulatory Visit: Payer: Self-pay

## 2022-07-23 DIAGNOSIS — N401 Enlarged prostate with lower urinary tract symptoms: Secondary | ICD-10-CM

## 2022-07-24 ENCOUNTER — Other Ambulatory Visit: Payer: Self-pay | Admitting: Family Medicine

## 2022-07-24 DIAGNOSIS — N401 Enlarged prostate with lower urinary tract symptoms: Secondary | ICD-10-CM

## 2022-07-31 NOTE — Telephone Encounter (Signed)
Please close chart

## 2022-08-23 ENCOUNTER — Other Ambulatory Visit: Payer: Self-pay | Admitting: Family Medicine

## 2022-08-23 DIAGNOSIS — N401 Enlarged prostate with lower urinary tract symptoms: Secondary | ICD-10-CM

## 2022-08-24 NOTE — Progress Notes (Unsigned)
Name: Melvin Williams   MRN: [id number]    DOB: 1960-08-26   Date:08/27/2022       Progress Note  Subjective  Chief Complaint  Follow Up  HPI  HTN: he had lost weight on his last visit and was off lisinopril for a while, however he started to gain weight and BP went up again, he has been back on lisinopril, denies side effects and bp is at goal  No chest pain, palpitation or SOB. BP is at goal   Dysthymia: his 63 yo nephew died in 06/12/22 from complications of pneumonia, he has been very sad since. He is gradually feeling better  BPH: he feels like medication has helped with symptoms and wants to continue flomax, last PSA improved, he is due for repeat PSA but wants to wait until next month when he comes in for his physical   History of kidney stones: no recent episodes of hematuria, he passed one stone in 2020, he sees Urologist   Cervical radiculitis: he had MRI back in 2008 , seen neurosurgeon but since symptoms have been mild surgery was not indicated, he continues to have some paresthesias on both hands when raising hands above his head but very mild now .  He states also has some hand weakness intermittently but is chronic and stable.   Hyperlipidemia: he has not been taking medications, last LDL was up to 113 .Discussed ASCVD and advised statin therapy . We will recheck labs and if still elevated he is willing to take statin therapy.   The 10-year ASCVD risk score (Arnett DK, et al., 2019) is: 11.9%   Values used to calculate the score:     Age: 18 years     Sex: Male     Is Non-Hispanic African American: No     Diabetic: No     Tobacco smoker: No     Systolic Blood Pressure: 132 mmHg     Is BP treated: Yes     HDL Cholesterol: 61 mg/dL     Total Cholesterol: 202 mg/dL   Obesity:  pre-diabetes and HTN, dyslipidemia. He states he likes to eat . Weight is up again, he knows he needs to increase regular physical activity   Chronic left lower extremity DVT :seen by Dr. Wyn Quaker,  unprovoked and recurrent , diagnosed Summer 2023 he will follow up with Dr. Wyn Quaker in January for repeat doppler, he denies any calf pain or swelling , he is taking Eliquis 5 mg BID and has yearly follow ups with Dr. Wyn Quaker   Cholelithiasis: diagnosed many years ago, controlled with diet , no recent episodes of pain lately but he has intermittent diarrhea   Pre-diabetes: last A1C was up from 6.1 % to 6.4 % he denies polyphagia, polydipsia, he has urinary frequency but since he was in his 30's and is unchanged   Patient Active Problem List   Diagnosis Date Noted   DVT (deep venous thrombosis) (HCC) 09/06/2021   Morbid obesity, unspecified obesity type (HCC) 10/17/2017   Benign prostatic hyperplasia without lower urinary tract symptoms 04/11/2016   PSA elevation 04/11/2016   Pre-diabetes 04/11/2016   Hypertension, benign 10/10/2015   Hyperlipidemia 10/10/2015   Hyperglycemia 10/10/2015   Vitamin D deficiency 10/10/2015   Microalbuminuria 10/10/2015   Eczema 10/10/2015   Seborrhea 10/10/2015   Fatty liver 10/10/2015   Cholelithiasis 10/10/2015   Cervical disc disease 10/10/2015    History reviewed. No pertinent surgical history.  Family History  Problem Relation Age of  Onset   Breast cancer Mother    Liver cancer Mother    Heart disease Father    Heart attack Father    Heart attack Paternal Grandfather     Social History   Tobacco Use   Smoking status: Never   Smokeless tobacco: Never  Substance Use Topics   Alcohol use: No     Current Outpatient Medications:    Cholecalciferol (VITAMIN D) 50 MCG (2000 UT) CAPS, Take 1 capsule by mouth daily., Disp: , Rfl:    tamsulosin (FLOMAX) 0.4 MG CAPS capsule, TAKE 1 CAPSULE(0.4 MG) BY MOUTH DAILY, Disp: 30 capsule, Rfl: 0   apixaban (ELIQUIS) 5 MG TABS tablet, Take 1 tablet (5 mg total) by mouth 2 (two) times daily., Disp: 180 tablet, Rfl: 1   lisinopril (ZESTRIL) 10 MG tablet, Take 1 tablet (10 mg total) by mouth daily. TAKE 1 TABLET  BY MOUTH EVERY DAY, Disp: 90 tablet, Rfl: 3  No Known Allergies  I personally reviewed active problem list, medication list, allergies, family history, social history, health maintenance with the patient/caregiver today.   ROS  Constitutional: Negative for fever , positive for weight change.  Respiratory: Negative for cough and shortness of breath.   Cardiovascular: Negative for chest pain or palpitations.  Gastrointestinal: Negative for abdominal pain, no bowel changes.  Musculoskeletal: Negative for gait problem or joint swelling.  Skin: Negative for rash.  Neurological: Negative for dizziness or headache.  No other specific complaints in a complete review of systems (except as listed in HPI above).   Objective  Vitals:   08/27/22 0825  BP: 132/84  Pulse: 79  Resp: 16  Temp: 97.7 F (36.5 C)  TempSrc: Oral  SpO2: 99%  Weight: 219 lb 4.8 oz (99.5 kg)  Height: 5\' 7"  (1.702 m)    Body mass index is 34.35 kg/m.  Physical Exam  Constitutional: Patient appears well-developed and well-nourished. Obese  No distress.  HEENT: head atraumatic, normocephalic, pupils equal and reactive to light, neck supple Cardiovascular: Normal rate, regular rhythm and normal heart sounds.  No murmur heard. No BLE edema. Pulmonary/Chest: Effort normal and breath sounds normal. No respiratory distress. Abdominal: Soft.  There is no tenderness. Psychiatric: Patient has a normal mood and affect. behavior is normal. Judgment and thought content normal.    PHQ2/9:    08/27/2022    8:27 AM 01/18/2022    7:28 AM 12/21/2021    7:32 AM 08/17/2021    7:51 AM 07/26/2021    8:42 AM  Depression screen PHQ 2/9  Decreased Interest 1 1 1  0 0  Down, Depressed, Hopeless 1 1 0 0 0  PHQ - 2 Score 2 2 1  0 0  Altered sleeping 3 0 0    Tired, decreased energy 3 3 0    Change in appetite 1 0 0    Feeling bad or failure about yourself  0 0 0    Trouble concentrating 1 3 0    Moving slowly or fidgety/restless  1 0 0    Suicidal thoughts 0 0 0    PHQ-9 Score 11 8 1     Difficult doing work/chores Not difficult at all        phq 9 is positive   Fall Risk:    08/27/2022    8:26 AM 01/18/2022    7:28 AM 12/21/2021    7:32 AM 08/17/2021    7:51 AM 07/26/2021    8:41 AM  Fall Risk   Falls in the  past year? 0 0 0 0 0  Number falls in past yr:  0 0 0 0  Injury with Fall?  0 0 0 0  Risk for fall due to : No Fall Risks No Fall Risks No Fall Risks No Fall Risks No Fall Risks  Follow up Falls prevention discussed Falls prevention discussed Falls prevention discussed Falls prevention discussed Falls prevention discussed      Functional Status Survey: Is the patient deaf or have difficulty hearing?: No Does the patient have difficulty seeing, even when wearing glasses/contacts?: No Does the patient have difficulty concentrating, remembering, or making decisions?: Yes Does the patient have difficulty walking or climbing stairs?: Yes Does the patient have difficulty dressing or bathing?: No Does the patient have difficulty doing errands alone such as visiting a doctor's office or shopping?: No    Assessment & Plan  1. Leg DVT (deep venous thromboembolism), chronic, left (HCC)  - apixaban (ELIQUIS) 5 MG TABS tablet; Take 1 tablet (5 mg total) by mouth 2 (two) times daily.  Dispense: 180 tablet; Refill: 1  2. Prediabetes  - Hemoglobin A1c  3. Hypertension, benign  - lisinopril (ZESTRIL) 10 MG tablet; Take 1 tablet (10 mg total) by mouth daily. TAKE 1 TABLET BY MOUTH EVERY DAY  Dispense: 90 tablet; Refill: 3  4. Vitamin D deficiency  Continue vitamin D   5. Benign prostatic hyperplasia with lower urinary tract symptoms, symptom details unspecified  Continue follow up with urologist   6. Essential hypertension  - COMPLETE METABOLIC PANEL WITH GFR  7. Mixed hyperlipidemia  - Lipid panel  8. Colon cancer screening  - Ambulatory referral to Gastroenterology - Fecal Globin By  Percell Belt

## 2022-08-27 ENCOUNTER — Encounter: Payer: Self-pay | Admitting: Family Medicine

## 2022-08-27 ENCOUNTER — Ambulatory Visit: Payer: PRIVATE HEALTH INSURANCE | Admitting: Family Medicine

## 2022-08-27 VITALS — BP 132/84 | HR 79 | Temp 97.7°F | Resp 16 | Ht 67.0 in | Wt 219.3 lb

## 2022-08-27 DIAGNOSIS — E559 Vitamin D deficiency, unspecified: Secondary | ICD-10-CM | POA: Diagnosis not present

## 2022-08-27 DIAGNOSIS — R7303 Prediabetes: Secondary | ICD-10-CM

## 2022-08-27 DIAGNOSIS — I82502 Chronic embolism and thrombosis of unspecified deep veins of left lower extremity: Secondary | ICD-10-CM

## 2022-08-27 DIAGNOSIS — E782 Mixed hyperlipidemia: Secondary | ICD-10-CM

## 2022-08-27 DIAGNOSIS — I10 Essential (primary) hypertension: Secondary | ICD-10-CM | POA: Diagnosis not present

## 2022-08-27 DIAGNOSIS — N401 Enlarged prostate with lower urinary tract symptoms: Secondary | ICD-10-CM

## 2022-08-27 DIAGNOSIS — Z1211 Encounter for screening for malignant neoplasm of colon: Secondary | ICD-10-CM

## 2022-08-27 LAB — LIPID PANEL
Non-HDL Cholesterol (Calc): 152 mg/dL (calc) — ABNORMAL HIGH (ref <130)
Total CHOL/HDL Ratio: 3.6 (calc) (ref <5.0)

## 2022-08-27 LAB — COMPLETE METABOLIC PANEL WITH GFR
CO2: 28 mmol/L (ref 20–32)
Glucose, Bld: 93 mg/dL (ref 65–99)
Total Bilirubin: 0.6 mg/dL (ref 0.2–1.2)
Total Protein: 7.3 g/dL (ref 6.1–8.1)
eGFR: 104 mL/min/{1.73_m2} (ref >=60)

## 2022-08-27 MED ORDER — LISINOPRIL 10 MG PO TABS: 10.0000 mg | ORAL_TABLET | Freq: Every day | ORAL | 3 refills | Status: DC

## 2022-08-27 MED ORDER — APIXABAN 5 MG PO TABS: 5.0000 mg | ORAL_TABLET | Freq: Two times a day (BID) | ORAL | 1 refills | Status: DC

## 2022-08-28 ENCOUNTER — Other Ambulatory Visit: Payer: Self-pay | Admitting: Family Medicine

## 2022-08-28 LAB — COMPLETE METABOLIC PANEL WITH GFR
AG Ratio: 1.5 (calc) (ref 1.0–2.5)
ALT: 16 U/L (ref 9–46)
AST: 13 U/L (ref 10–35)
Albumin: 4.4 g/dL (ref 3.6–5.1)
Alkaline phosphatase (APISO): 60 U/L (ref 35–144)
BUN: 15 mg/dL (ref 7–25)
Calcium: 9.8 mg/dL (ref 8.6–10.3)
Chloride: 107 mmol/L (ref 98–110)
Creat: 0.7 mg/dL (ref 0.70–1.35)
Globulin: 2.9 g/dL (calc) (ref 1.9–3.7)
Potassium: 5.1 mmol/L (ref 3.5–5.3)
Sodium: 141 mmol/L (ref 135–146)

## 2022-08-28 LAB — LIPID PANEL
Cholesterol: 211 mg/dL — ABNORMAL HIGH (ref <200)
HDL: 59 mg/dL (ref >=40)
LDL Cholesterol (Calc): 123 mg/dL (calc) — ABNORMAL HIGH
Triglycerides: 169 mg/dL — ABNORMAL HIGH (ref <150)

## 2022-08-28 LAB — HEMOGLOBIN A1C
Hgb A1c MFr Bld: 6.2 % of total Hgb — ABNORMAL HIGH (ref <5.7)
Mean Plasma Glucose: 131 mg/dL
eAG (mmol/L): 7.3 mmol/L

## 2022-08-28 MED ORDER — ROSUVASTATIN CALCIUM 10 MG PO TABS: 10.0000 mg | ORAL_TABLET | Freq: Every day | ORAL | 1 refills | Status: DC

## 2022-09-19 ENCOUNTER — Other Ambulatory Visit: Payer: Self-pay | Admitting: Family Medicine

## 2022-09-19 DIAGNOSIS — N401 Enlarged prostate with lower urinary tract symptoms: Secondary | ICD-10-CM

## 2022-09-28 NOTE — Telephone Encounter (Signed)
Copied from CRM [id number]. Topic: General - Other >> Sep 28, 2022 11:48 AM Everette C wrote: Reason for CRM: The patient has been directed by [facility] to contact their PCP and request that copies of their prescription for apixaban (ELIQUIS) 5 MG TABS tablet [[id number]] be faxed to [phone]  Please contact further if needed

## 2022-09-28 NOTE — Telephone Encounter (Signed)
Medication Refill - Medication: apixaban (ELIQUIS) 5 MG TABS   Pt would like the script for this time to be sent to the below [facility] and the mail order and all future refills will come through the mail. Requesting fax to be sent to [facility].  Has the patient contacted their pharmacy? Yes.   (Agent: If no, request that the patient contact the pharmacy for the refill. If patient does not wish to contact the pharmacy document the reason why and proceed with request.) (Agent: If yes, when and what did the pharmacy advise?)  Preferred Pharmacy (with phone number or street name): [id number] [place],  - [address] AT [facility] OF [address] [facility] Phone: [phone]  Fax: [phone]   Has the patient been seen for an appointment in the last year OR does the patient have an upcoming appointment? Yes.    Agent: Please be advised that RX refills may take up to 3 business days. We ask that you follow-up with your pharmacy.

## 2022-10-04 ENCOUNTER — Other Ambulatory Visit: Payer: Self-pay | Admitting: Family Medicine

## 2022-10-04 DIAGNOSIS — I82502 Chronic embolism and thrombosis of unspecified deep veins of left lower extremity: Secondary | ICD-10-CM

## 2022-10-04 NOTE — Telephone Encounter (Signed)
Medication Refill - Medication: apixaban (ELIQUIS) 5 MG TABS tablet   Has the patient contacted their pharmacy? Yes.   Pt told to contact provider  Preferred Pharmacy (with phone number or street name): [facility]  Ph# [phone]  Fax# [phone]  Has the patient been seen for an appointment in the last year OR does the patient have an upcoming appointment? Yes.    Agent: Please be advised that RX refills may take up to 3 business days. We ask that you follow-up with your pharmacy.

## 2022-10-04 NOTE — Telephone Encounter (Signed)
Pharmacy [facility], Ph# [phone] could not be found.

## 2022-10-05 ENCOUNTER — Telehealth: Payer: Self-pay | Admitting: Family Medicine

## 2022-10-05 MED ORDER — APIXABAN 5 MG PO TABS: 5.0000 mg | ORAL_TABLET | Freq: Two times a day (BID) | ORAL | 1 refills | Status: DC

## 2022-10-05 NOTE — Telephone Encounter (Signed)
Called to patient verified contact numbers for pharmacy are correct- advised Rx is awaiting prescriber to fax

## 2022-10-05 NOTE — Telephone Encounter (Signed)
Pt is calling in because he says his prescription is supposed to be sent to [facility] and it hasn't been sent in yet. Pt wants to know is there any additional information needed from him regarding apixaban (ELIQUIS) 5 MG TABS tablet . Please advise.

## 2022-10-10 ENCOUNTER — Telehealth: Payer: Self-pay

## 2022-10-10 DIAGNOSIS — I82502 Chronic embolism and thrombosis of unspecified deep veins of left lower extremity: Secondary | ICD-10-CM

## 2022-10-10 NOTE — Telephone Encounter (Signed)
Karena Addison from [facility] called to follow up on request stating that they have yet to receive correspondence.   Best contact: [phone]

## 2022-10-10 NOTE — Telephone Encounter (Signed)
Returned call to [facility] as we cannot e-prescribe to them. Awaiting fax/prescribing information.

## 2022-10-12 NOTE — Telephone Encounter (Signed)
Pt calling to follow up on his apixaban (ELIQUIS) 5 MG TABS tablet Rx. Pt states [facility] website tells him they are waiting on Dr response.  Please advise.

## 2022-10-15 NOTE — Telephone Encounter (Signed)
Attempted to contact Karena Addison at the number provided, left voicemail and am awaiting return call.

## 2022-10-18 NOTE — Telephone Encounter (Signed)
Pt is calling back to check on the status of his medication   apixaban (ELIQUIS) 5 MG TABS .  Pt states to call [phone] and press opt 1 to speak to a live person. Pt states that he cannot run out of the medication due to it being a blood thinner.    Other number for [facility] is [phone]

## 2022-10-19 ENCOUNTER — Other Ambulatory Visit: Payer: Self-pay

## 2022-10-19 DIAGNOSIS — I82502 Chronic embolism and thrombosis of unspecified deep veins of left lower extremity: Secondary | ICD-10-CM

## 2022-10-19 MED ORDER — APIXABAN 5 MG PO TABS: 5.0000 mg | ORAL_TABLET | Freq: Two times a day (BID) | ORAL | 1 refills | Status: DC

## 2022-10-19 NOTE — Telephone Encounter (Signed)
Called and lvm for return call.

## 2022-10-19 NOTE — Telephone Encounter (Signed)
Copied from CRM [id number]. Topic: General - Other >> Oct 19, 2022  1:10 PM Phill Myron wrote: Reason for CRM: attnCarollee Herter  please call [phone] option 1 and you can speak with any of the advocates. [facility] is with [facility] if you need a rx to be e-scribed    To fax an RX to [facility] : [phone]

## 2022-12-21 ENCOUNTER — Other Ambulatory Visit: Payer: Self-pay | Admitting: Family Medicine

## 2022-12-21 DIAGNOSIS — N401 Enlarged prostate with lower urinary tract symptoms: Secondary | ICD-10-CM

## 2022-12-21 MED ORDER — TAMSULOSIN HCL 0.4 MG PO CAPS: ORAL_CAPSULE | ORAL | 0 refills | Status: DC

## 2023-01-30 NOTE — Progress Notes (Unsigned)
Name: Melvin Williams   MRN: [id number]    DOB: 1960-12-26   Date:01/31/2023       Progress Note  Subjective  Chief Complaint  Annual Exam  HPI  Patient presents for annual CPE.  IPSS Questionnaire (AUA-7): Over the past month.   1)  How often have you had a sensation of not emptying your bladder completely after you finish urinating?  0 - Not at all  2)  How often have you had to urinate again less than two hours after you finished urinating? 5 - Almost always  3)  How often have you found you stopped and started again several times when you urinated?  5 - Almost always  4) How difficult have you found it to postpone urination?  5 - Almost always  5) How often have you had a weak urinary stream?  0 - Not at all  6) How often have you had to push or strain to begin urination?  0 - Not at all  7) How many times did you most typically get up to urinate from the time you went to bed until the time you got up in the morning?  5 - 5+ times  Total score:  0-7 mildly symptomatic   8-19 moderately symptomatic   20-35 severely symptomatic     Diet: eating fast food, trying to cut down on carbohydrates  Exercise: discussed importance of regular physical activity  Last Dental Exam: Never Last Eye Exam: Unsure  Depression: phq 9 is positive - he is still missing his nephew that died 04/04/24st 2024 - he does not want medication or talking to a grieving counselor. He also has a stressful job as a Merchandiser, retail      01/31/2023    8:11 AM 08/27/2022    8:27 AM 01/18/2022    7:28 AM 12/21/2021    7:32 AM 08/17/2021    7:51 AM  Depression screen PHQ 2/9  Decreased Interest 1 1 1 1  0  Down, Depressed, Hopeless 1 1 1  0 0  PHQ - 2 Score 2 2 2 1  0  Altered sleeping 3 3 0 0   Tired, decreased energy 3 3 3  0   Change in appetite 0 1 0 0   Feeling bad or failure about yourself  0 0 0 0   Trouble concentrating 3 1 3  0   Moving slowly or fidgety/restless 0 1 0 0   Suicidal thoughts 0 0 0 0   PHQ-9  Score 11 11 8 1    Difficult doing work/chores  Not difficult at all       Hypertension:  BP Readings from Last 3 Encounters:  01/31/23 128/84  08/27/22 132/84  03/30/22 119/80    Obesity: Wt Readings from Last 3 Encounters:  01/31/23 238 lb (108 kg)  08/27/22 219 lb 4.8 oz (99.5 kg)  03/30/22 220 lb 12.8 oz (100.2 kg)   BMI Readings from Last 3 Encounters:  01/31/23 37.28 kg/m  08/27/22 34.35 kg/m  03/30/22 34.58 kg/m     Lipids:  Lab Results  Component Value Date   CHOL 211 (H) 08/27/2022   CHOL 202 (H) 01/18/2022   CHOL 164 12/22/2020   Lab Results  Component Value Date   HDL 59 08/27/2022   HDL 61 01/18/2022   HDL 51 12/22/2020   Lab Results  Component Value Date   LDLCALC 123 (H) 08/27/2022   LDLCALC 113 (H) 01/18/2022   LDLCALC 93 12/22/2020  Lab Results  Component Value Date   TRIG 169 (H) 08/27/2022   TRIG 166 (H) 01/18/2022   TRIG 102 12/22/2020   Lab Results  Component Value Date   CHOLHDL 3.6 08/27/2022   CHOLHDL 3.3 01/18/2022   CHOLHDL 3.2 12/22/2020   No results found for: "LDLDIRECT" Glucose:  Glucose, Bld  Date Value Ref Range Status  08/27/2022 93 65 - 99 mg/dL Final    Comment:    .            Fasting reference interval .   01/18/2022 102 (H) 65 - 99 mg/dL Final    Comment:    .            Fasting reference interval . For someone without known diabetes, a glucose value between 100 and 125 mg/dL is consistent with prediabetes and should be confirmed with a follow-up test. .   08/09/2021 118 (H) 70 - 99 mg/dL Final    Comment:    Glucose reference range applies only to samples taken after fasting for at least 8 hours.    Flowsheet Row Office Visit from 08/27/2022 in [facility]  AUDIT-C Score 1       Single STD testing and prevention (HIV/chl/gon/syphilis): N/A Sexual history: not sexually active  Hep C Screening: 10/11/15 Skin cancer: Discussed monitoring for atypical  lesions Colorectal cancer: 07/20/10 Prostate cancer:   Lab Results  Component Value Date   PSA 4.61 (H) 01/18/2022   PSA 4.50 (H) 03/17/2021   PSA 5.39 (H) 12/22/2020     Lung cancer:  Low Dose CT Chest recommended if Age 93-80 years, 30 pack-year currently smoking OR have quit w/in 15years. Patient  no a candidate for screening   AAA: The USPSTF recommends one-time screening with ultrasonography in men ages 54 to 75 years who have ever smoked. Patient   no, a candidate for screening  ECG:  10/11/16  Vaccines:   HPV: N/A Tdap: up to date Shingrix: up to date Pneumonia: N/A Flu: today COVID-19: up to date  Advanced Care Planning: A voluntary discussion about advance care planning including the explanation and discussion of advance directives.  Discussed health care proxy and Living will, and the patient was able to identify a health care proxy as sister.  Patient does not have a living will and power of attorney of health care   Patient Active Problem List   Diagnosis Date Noted   DVT (deep venous thrombosis) (HCC) 09/06/2021   Morbid obesity, unspecified obesity type (HCC) 10/17/2017   Benign prostatic hyperplasia without lower urinary tract symptoms 04/11/2016   PSA elevation 04/11/2016   Pre-diabetes 04/11/2016   Hypertension, benign 10/10/2015   Hyperlipidemia 10/10/2015   Hyperglycemia 10/10/2015   Vitamin D deficiency 10/10/2015   Microalbuminuria 10/10/2015   Eczema 10/10/2015   Seborrhea 10/10/2015   Fatty liver 10/10/2015   Cholelithiasis 10/10/2015   Cervical disc disease 10/10/2015    No past surgical history on file.  Family History  Problem Relation Age of Onset   Breast cancer Mother    Liver cancer Mother    Heart disease Father    Heart attack Father    Heart attack Paternal Grandfather     Social History   Socioeconomic History   Marital status: Single    Spouse name: Not on file   Number of children: 0   Years of education: Not on file    Highest education level: High school graduate  Occupational History  Occupation: Medical illustrator   Tobacco Use   Smoking status: Never   Smokeless tobacco: Never  Vaping Use   Vaping status: Never Used  Substance and Sexual Activity   Alcohol use: No   Drug use: No   Sexual activity: Never  Other Topics Concern   Not on file  Social History Narrative   Promoted to transportation supervisor    Social Determinants of Health   Financial Resource Strain: Low Risk  (01/31/2023)   Overall Financial Resource Strain (CARDIA)    Difficulty of Paying Living Expenses: Not hard at all  Food Insecurity: No Food Insecurity (01/31/2023)   Hunger Vital Sign    Worried About Running Out of Food in the Last Year: Never true    Ran Out of Food in the Last Year: Never true  Transportation Needs: No Transportation Needs (01/31/2023)   PRAPARE - Administrator, Civil Service (Medical): No    Lack of Transportation (Non-Medical): No  Physical Activity: Inactive (01/31/2023)   Exercise Vital Sign    Days of Exercise per Week: 0 days    Minutes of Exercise per Session: 0 min  Stress: Stress Concern Present (01/31/2023)   [employer] of Occupational Health - Occupational Stress Questionnaire    Feeling of Stress : Rather much  Social Connections: Socially Isolated (01/31/2023)   Social Connection and Isolation Panel [NHANES]    Frequency of Communication with Friends and Family: Never    Frequency of Social Gatherings with Friends and Family: Never    Attends Religious Services: Never    Database administrator or Organizations: No    Attends Banker Meetings: Never    Marital Status: Never married  Intimate Partner Violence: Not At Risk (01/31/2023)   Humiliation, Afraid, Rape, and Kick questionnaire    Fear of Current or Ex-Partner: No    Emotionally Abused: No    Physically Abused: No    Sexually Abused: No     Current Outpatient Medications:     apixaban (ELIQUIS) 5 MG TABS tablet, Take 1 tablet (5 mg total) by mouth 2 (two) times daily., Disp: 180 tablet, Rfl: 1   Cholecalciferol (VITAMIN D) 50 MCG (2000 UT) CAPS, Take 1 capsule by mouth daily., Disp: , Rfl:    lisinopril (ZESTRIL) 10 MG tablet, Take 1 tablet (10 mg total) by mouth daily. TAKE 1 TABLET BY MOUTH EVERY DAY, Disp: 90 tablet, Rfl: 3   rosuvastatin (CRESTOR) 10 MG tablet, Take 1 tablet (10 mg total) by mouth daily., Disp: 90 tablet, Rfl: 1   tamsulosin (FLOMAX) 0.4 MG CAPS capsule, TAKE 1 CAPSULE(0.4 MG) BY MOUTH DAILY, Disp: 90 capsule, Rfl: 0  No Known Allergies   ROS  Constitutional: Negative for fever , positive for  weight change.  Respiratory: Negative for cough and shortness of breath.   Cardiovascular: Negative for chest pain or palpitations.  Gastrointestinal: Negative for abdominal pain, no bowel changes.  Musculoskeletal: Negative for gait problem or joint swelling.  Skin: positive for skin lesion on left side of face also rash on left hand Neurological: Negative for dizziness or headache.  No other specific complaints in a complete review of systems (except as listed in HPI above).    Objective  Vitals:   01/31/23 0809  BP: 128/84  Pulse: 83  Resp: 16  SpO2: 96%  Weight: 238 lb (108 kg)  Height: 5\' 7"  (1.702 m)    Body mass index is 37.28 kg/m.  Physical Exam  Constitutional: Patient appears well-developed and well-nourished. No distress.  HENT: Head: Normocephalic and atraumatic. Ears: B TMs ok, no erythema or effusion; Nose: Nose normal. Mouth/Throat: Oropharynx is clear and moist. No oropharyngeal exudate.  Eyes: Conjunctivae and EOM are normal. Pupils are equal, round, and reactive to light. No scleral icterus.  Neck: Normal range of motion. Neck supple. No JVD present. No thyromegaly present.  Cardiovascular: Normal rate, regular rhythm and normal heart sounds.  No murmur heard. No BLE edema. Pulmonary/Chest: Effort normal and breath  sounds normal. No respiratory distress. Abdominal: Soft. Bowel sounds are normal, no distension. There is no tenderness. no masses MALE GENITALIA: small  testes bilaterally, no masses palpated, no hernias, no lesions, no discharge RECTAL: not done - referring to urologist  Musculoskeletal: Normal range of motion, no joint effusions. No gross deformities Neurological: he is alert and oriented to person, place, and time. No cranial nerve deficit. Coordination, balance, strength, speech and gait are normal.  Skin: rash on left hand and spot on left side of face with some telangiectasis below and some puffiness on the same side. Advised referral to dermatologist. Pictures attached  Psychiatric: Patient has a normal mood and affect. behavior is normal. Judgment and thought content normal.    Fall Risk:    01/31/2023    8:11 AM 08/27/2022    8:26 AM 01/18/2022    7:28 AM 12/21/2021    7:32 AM 08/17/2021    7:51 AM  Fall Risk   Falls in the past year? 0 0 0 0 0  Number falls in past yr: 0  0 0 0  Injury with Fall? 0  0 0 0  Risk for fall due to : No Fall Risks No Fall Risks No Fall Risks No Fall Risks No Fall Risks  Follow up Falls prevention discussed Falls prevention discussed Falls prevention discussed Falls prevention discussed Falls prevention discussed     Functional Status Survey: Is the patient deaf or have difficulty hearing?: No Does the patient have difficulty seeing, even when wearing glasses/contacts?: No Does the patient have difficulty concentrating, remembering, or making decisions?: Yes Does the patient have difficulty walking or climbing stairs?: Yes Does the patient have difficulty dressing or bathing?: No Does the patient have difficulty doing errands alone such as visiting a doctor's office or shopping?: No    Assessment & Plan  1. Well adult exam  - COMPLETE METABOLIC PANEL WITH GFR - PSA - Hemoglobin A1c - CBC with Differential/Platelet - Ambulatory referral  to Urology  2. Need for immunization against influenza  - Flu vaccine trivalent PF, 6mos and older(Flulaval,Afluria,Fluarix,Fluzone)  3. Elevated PSA  - Hemoglobin A1c - Ambulatory referral to Urology  4. Benign prostatic hyperplasia with lower urinary tract symptoms, symptom details unspecified  - Hemoglobin A1c  5. Prediabetes  - Hemoglobin A1c    6. Skin lesion  - Ambulatory referral to Dermatology  7. Rash of hand  - Ambulatory referral to Dermatology   -Prostate cancer screening and PSA options (with potential risks and benefits of testing vs not testing) were discussed along with recent recs/guidelines. -USPSTF grade A and B recommendations reviewed with patient; age-appropriate recommendations, preventive care, screening tests, etc discussed and encouraged; healthy living encouraged; see AVS for patient education given to patient -Discussed importance of 150 minutes of physical activity weekly, eat two servings of fish weekly, eat one serving of tree nuts ( cashews, pistachios, pecans, almonds.Marland Kitchen) every other day, eat 6 servings of fruit/vegetables daily and drink plenty  of water and avoid sweet beverages.  -Reviewed Health Maintenance: yes

## 2023-01-31 ENCOUNTER — Other Ambulatory Visit: Payer: Self-pay

## 2023-01-31 ENCOUNTER — Encounter: Payer: Self-pay | Admitting: Family Medicine

## 2023-01-31 ENCOUNTER — Other Ambulatory Visit: Payer: Self-pay | Admitting: Family Medicine

## 2023-01-31 ENCOUNTER — Ambulatory Visit (INDEPENDENT_AMBULATORY_CARE_PROVIDER_SITE_OTHER): Payer: PRIVATE HEALTH INSURANCE | Admitting: Family Medicine

## 2023-01-31 VITALS — BP 128/84 | HR 83 | Resp 16 | Ht 67.0 in | Wt 238.0 lb

## 2023-01-31 DIAGNOSIS — Z23 Encounter for immunization: Secondary | ICD-10-CM

## 2023-01-31 DIAGNOSIS — Z0000 Encounter for general adult medical examination without abnormal findings: Secondary | ICD-10-CM

## 2023-01-31 DIAGNOSIS — N401 Enlarged prostate with lower urinary tract symptoms: Secondary | ICD-10-CM

## 2023-01-31 DIAGNOSIS — Z0001 Encounter for general adult medical examination with abnormal findings: Secondary | ICD-10-CM | POA: Diagnosis not present

## 2023-01-31 DIAGNOSIS — M79609 Pain in unspecified limb: Secondary | ICD-10-CM

## 2023-01-31 DIAGNOSIS — R7303 Prediabetes: Secondary | ICD-10-CM

## 2023-01-31 DIAGNOSIS — R972 Elevated prostate specific antigen [PSA]: Secondary | ICD-10-CM | POA: Diagnosis not present

## 2023-01-31 DIAGNOSIS — R21 Rash and other nonspecific skin eruption: Secondary | ICD-10-CM

## 2023-01-31 DIAGNOSIS — L989 Disorder of the skin and subcutaneous tissue, unspecified: Secondary | ICD-10-CM

## 2023-01-31 DIAGNOSIS — R202 Paresthesia of skin: Secondary | ICD-10-CM

## 2023-01-31 NOTE — Addendum Note (Signed)
Addended by: Alba Cory F on: 01/31/2023 09:05 AM   Modules accepted: Orders

## 2023-02-01 LAB — COMPLETE METABOLIC PANEL WITH GFR
AG Ratio: 1.7 (calc) (ref 1.0–2.5)
ALT: 28 U/L (ref 9–46)
AST: 18 U/L (ref 10–35)
Albumin: 4.5 g/dL (ref 3.6–5.1)
Alkaline phosphatase (APISO): 66 U/L (ref 35–144)
BUN: 20 mg/dL (ref 7–25)
CO2: 28 mmol/L (ref 20–32)
Calcium: 9.5 mg/dL (ref 8.6–10.3)
Chloride: 104 mmol/L (ref 98–110)
Creat: 0.75 mg/dL (ref 0.70–1.35)
Globulin: 2.6 g/dL (ref 1.9–3.7)
Glucose, Bld: 97 mg/dL (ref 65–99)
Potassium: 5.5 mmol/L — ABNORMAL HIGH (ref 3.5–5.3)
Sodium: 140 mmol/L (ref 135–146)
Total Bilirubin: 0.7 mg/dL (ref 0.2–1.2)
Total Protein: 7.1 g/dL (ref 6.1–8.1)
eGFR: 101 mL/min/{1.73_m2} (ref >=60)

## 2023-02-01 LAB — CBC WITH DIFFERENTIAL/PLATELET
Absolute Lymphocytes: 2248 {cells}/uL (ref 850–3900)
Absolute Monocytes: 496 {cells}/uL (ref 200–950)
Basophils Absolute: 40 {cells}/uL (ref 0–200)
Basophils Relative: 0.5 %
Eosinophils Absolute: 80 {cells}/uL (ref 15–500)
Eosinophils Relative: 1 %
HCT: 42.5 % (ref 38.5–50.0)
Hemoglobin: 13.8 g/dL (ref 13.2–17.1)
MCH: 27.3 pg (ref 27.0–33.0)
MCHC: 32.5 g/dL (ref 32.0–36.0)
MCV: 84 fL (ref 80.0–100.0)
MPV: 10.8 fL (ref 7.5–12.5)
Monocytes Relative: 6.2 %
Neutro Abs: 5136 {cells}/uL (ref 1500–7800)
Neutrophils Relative %: 64.2 %
Platelets: 297 10*3/uL (ref 140–400)
RBC: 5.06 10*6/uL (ref 4.20–5.80)
RDW: 14.4 % (ref 11.0–15.0)
Total Lymphocyte: 28.1 %
WBC: 8 10*3/uL (ref 3.8–10.8)

## 2023-02-01 LAB — PSA: PSA: 4.54 ng/mL — ABNORMAL HIGH (ref <=4.00)

## 2023-02-01 LAB — HEMOGLOBIN A1C
Hgb A1c MFr Bld: 6.5 %{Hb} — ABNORMAL HIGH (ref <5.7)
Mean Plasma Glucose: 140 mg/dL
eAG (mmol/L): 7.7 mmol/L

## 2023-02-01 LAB — B12 AND FOLATE PANEL
Folate: 16.3 ng/mL
Vitamin B-12: 436 pg/mL (ref 200–1100)

## 2023-02-13 ENCOUNTER — Ambulatory Visit (INDEPENDENT_AMBULATORY_CARE_PROVIDER_SITE_OTHER): Payer: PRIVATE HEALTH INSURANCE | Admitting: Urology

## 2023-02-13 VITALS — BP 125/81 | HR 76 | Ht 67.0 in | Wt 235.4 lb

## 2023-02-13 DIAGNOSIS — N401 Enlarged prostate with lower urinary tract symptoms: Secondary | ICD-10-CM

## 2023-02-13 DIAGNOSIS — R350 Frequency of micturition: Secondary | ICD-10-CM

## 2023-02-13 DIAGNOSIS — N138 Other obstructive and reflux uropathy: Secondary | ICD-10-CM

## 2023-02-13 DIAGNOSIS — R3129 Other microscopic hematuria: Secondary | ICD-10-CM | POA: Diagnosis not present

## 2023-02-13 DIAGNOSIS — R972 Elevated prostate specific antigen [PSA]: Secondary | ICD-10-CM | POA: Diagnosis not present

## 2023-02-13 LAB — MICROSCOPIC EXAMINATION

## 2023-02-13 LAB — URINALYSIS, COMPLETE
Bilirubin, UA: NEGATIVE
Glucose, UA: NEGATIVE
Ketones, UA: NEGATIVE
Leukocytes,UA: NEGATIVE
Nitrite, UA: NEGATIVE
Protein,UA: NEGATIVE
Specific Gravity, UA: 1.025 (ref 1.005–1.030)
Urobilinogen, Ur: 0.2 mg/dL (ref 0.2–1.0)
pH, UA: 5.5 (ref 5.0–7.5)

## 2023-02-13 LAB — BLADDER SCAN AMB NON-IMAGING: Scan Result: 46

## 2023-02-13 NOTE — Patient Instructions (Signed)

## 2023-02-13 NOTE — Progress Notes (Signed)
[name],acting as a scribe for Vanna Scotland, MD.,have documented all relevant documentation on the behalf of Vanna Scotland, MD,as directed by  Vanna Scotland, MD while in the presence of Vanna Scotland, MD.  02/13/23 4:43 PM   Melvin Williams [birth year] [id number]  Referring provider: Alba Cory, MD [address] [place],  [place] [address]  Chief Complaint  Patient presents with   Establish Care   Urinary Frequency    HPI: 62 year-old male who is a self-referral for urinary issues.   He was seen back in 2019 by Dr. Richardo Hanks for elevated/rising PSA. At the time, his PSA was only 3.2. He was recommended conservative management. He subsequently followed up with Michiel Cowboy and was last seen in 2020.  His most recent PSA on 01/31/2023 was elevated to 4.54, but this has been elevated and stable for many years.   His urinalysis today has 11-30 RBC per high powered field, otherwise negative.  He is on Flomax for his urinary symptoms, but he has not found this to be helpful. Symptoms primarily weak stream and frequency, nocturia x3. He experiences urgency and has difficulty holding urine, leading to occasional incontinence.  He has a history of kidney stones and reports past episodes of hematuria, possibly related to stone passage.   PSA  Latest Ref Rng < OR = 4.00 ng/mL  12/15/2018 3.1   12/16/2019 3.43   12/22/2020 5.39 (H)   03/17/2021 4.50 (H)   01/18/2022 4.61 (H)   01/31/2023 4.54 (H)     Results for orders placed or performed in visit on 02/13/23  Microscopic Examination   Urine  Result Value Ref Range   WBC, UA 0-5 0 - 5 /hpf   RBC, Urine 11-30 (A) 0 - 2 /hpf   Epithelial Cells (non renal) 0-10 0 - 10 /hpf   Bacteria, UA Few None seen/Few  Urinalysis, Complete  Result Value Ref Range   Specific Gravity, UA 1.025 1.005 - 1.030   pH, UA 5.5 5.0 - 7.5   Color, UA Yellow Yellow   Appearance Ur Clear Clear   Leukocytes,UA Negative Negative    Protein,UA Negative Negative/Trace   Glucose, UA Negative Negative   Ketones, UA Negative Negative   RBC, UA 2+ (A) Negative   Bilirubin, UA Negative Negative   Urobilinogen, Ur 0.2 0.2 - 1.0 mg/dL   Nitrite, UA Negative Negative   Microscopic Examination See below:   Bladder Scan (Post Void Residual) in office  Result Value Ref Range   Scan Result 46 ml     IPSS     Row Name 02/13/23 1500         International Prostate Symptom Score   How often have you had the sensation of not emptying your bladder? About half the time     How often have you had to urinate less than every two hours? Almost always     How often have you found you stopped and started again several times when you urinated? More than half the time     How often have you found it difficult to postpone urination? More than half the time     How often have you had a weak urinary stream? Almost always     How often have you had to strain to start urination? Not at All     How many times did you typically get up at night to urinate? 3 Times     Total IPSS Score  24       Quality of Life due to urinary symptoms   If you were to spend the rest of your life with your urinary condition just the way it is now how would you feel about that? Mixed              Score:  1-7 Mild 8-19 Moderate 20-35 Severe    PMH: Past Medical History:  Diagnosis Date   GERD (gastroesophageal reflux disease)    Hyperlipidemia    Hypertension     Home Medications:  Allergies as of 02/13/2023   No Known Allergies      Medication List        Accurate as of February 13, 2023  4:43 PM. If you have any questions, ask your nurse or doctor.          apixaban 5 MG Tabs tablet Commonly known as: Eliquis Take 1 tablet (5 mg total) by mouth 2 (two) times daily.   lisinopril 10 MG tablet Commonly known as: ZESTRIL Take 1 tablet (10 mg total) by mouth daily. TAKE 1 TABLET BY MOUTH EVERY DAY   rosuvastatin 10 MG  tablet Commonly known as: Crestor Take 1 tablet (10 mg total) by mouth daily.   tamsulosin 0.4 MG Caps capsule Commonly known as: FLOMAX TAKE 1 CAPSULE(0.4 MG) BY MOUTH DAILY   Vitamin D 50 MCG (2000 UT) Caps Take 1 capsule by mouth daily.        Family History: Family History  Problem Relation Age of Onset   Breast cancer Mother    Liver cancer Mother    Heart disease Father    Heart attack Father    Heart attack Paternal Grandfather     Social History:  reports that he has never smoked. He has never used smokeless tobacco. He reports that he does not drink alcohol and does not use drugs.   Physical Exam: BP 125/81   Pulse 76   Ht 5\' 7"  (1.702 m)   Wt 235 lb 6 oz (106.8 kg)   BMI 36.86 kg/m   Constitutional:  Alert and oriented, No acute distress. HEENT: [place] AT, moist mucus membranes.  Trachea midline, no masses. GU: Normal sphincter tone. 60 gram prostate. Rubbery bilaterally. No nodularity Neurologic: Grossly intact, no focal deficits, moving all 4 extremities. Psychiatric: Normal mood and affect.   Assessment & Plan:    1. Elevated/Rising PSA - DRE today - Continue monitoring PSA levels, given stability over the past years. - Consider prostate MRI if future PSA trends indicate significant changes.  2. BPH with outlet obstruction - Currently on Flomax with poorly controlled urinary symptoms - Symptoms primarily weak stream and frequency, nocturia x3 -Will prostate with hematuria work up as below  3. Microscopic hematuria - UA today has 11-30 RBC per high powered field, otherwise negative - CT urogram and cystoscopy  We will hold on prostate MRI until hematuria workup is completed.   Return for CT urogram and cystoscopy.  I have reviewed the above documentation for accuracy and completeness, and I agree with the above.   Vanna Scotland, MD   [facility] [address] [place], [place] [address] [phone]

## 2023-02-21 ENCOUNTER — Other Ambulatory Visit: Payer: Self-pay | Admitting: Family Medicine

## 2023-02-21 NOTE — Addendum Note (Signed)
Addended by: Consuella Lose on: 02/21/2023 04:48 PM   Modules accepted: Orders

## 2023-02-22 ENCOUNTER — Telehealth: Payer: Self-pay | Admitting: Urology

## 2023-02-22 NOTE — Telephone Encounter (Signed)
Order faxed over as requested.

## 2023-02-22 NOTE — Telephone Encounter (Signed)
Received call from [facility] with [facility]. She stated they need CT orders for patient. She said orders were sent to [facility], but patient's insurance will only pay if done by [facility]. If it is CT with contrast, they will need any lab records. Fax number to send order to is: [phone]. If questions, call Aram Beecham at [phone]

## 2023-02-22 NOTE — Addendum Note (Signed)
Addended by: Consuella Lose on: 02/22/2023 01:43 PM   Modules accepted: Orders

## 2023-02-27 ENCOUNTER — Ambulatory Visit
Admission: RE | Admit: 2023-02-27 | Discharge: 2023-02-27 | Disposition: A | Discharge status: Home / Self Care | Payer: Self-pay | Source: Ambulatory Visit | Attending: Urology | Admitting: Urology

## 2023-02-27 DIAGNOSIS — R350 Frequency of micturition: Secondary | ICD-10-CM

## 2023-02-27 DIAGNOSIS — R3129 Other microscopic hematuria: Secondary | ICD-10-CM

## 2023-02-27 MED ORDER — IOPAMIDOL (ISOVUE-300) INJECTION 61%
100.0000 mL | Freq: Once | INTRAVENOUS | Status: AC | PRN
  Administered 2023-02-27: 100 mL via INTRAVENOUS

## 2023-03-21 ENCOUNTER — Other Ambulatory Visit: Payer: Self-pay | Admitting: Family Medicine

## 2023-03-21 DIAGNOSIS — N401 Enlarged prostate with lower urinary tract symptoms: Secondary | ICD-10-CM

## 2023-03-25 ENCOUNTER — Other Ambulatory Visit: Payer: Self-pay | Admitting: Family Medicine

## 2023-03-25 DIAGNOSIS — N401 Enlarged prostate with lower urinary tract symptoms: Secondary | ICD-10-CM

## 2023-04-03 ENCOUNTER — Ambulatory Visit (INDEPENDENT_AMBULATORY_CARE_PROVIDER_SITE_OTHER): Payer: PRIVATE HEALTH INSURANCE | Admitting: Urology

## 2023-04-03 VITALS — BP 125/80 | HR 99

## 2023-04-03 DIAGNOSIS — R3129 Other microscopic hematuria: Secondary | ICD-10-CM

## 2023-04-03 DIAGNOSIS — N200 Calculus of kidney: Secondary | ICD-10-CM | POA: Diagnosis not present

## 2023-04-03 DIAGNOSIS — R350 Frequency of micturition: Secondary | ICD-10-CM | POA: Diagnosis not present

## 2023-04-03 DIAGNOSIS — N401 Enlarged prostate with lower urinary tract symptoms: Secondary | ICD-10-CM | POA: Diagnosis not present

## 2023-04-03 LAB — URINALYSIS, COMPLETE
Bilirubin, UA: NEGATIVE
Glucose, UA: NEGATIVE
Ketones, UA: NEGATIVE
Leukocytes,UA: NEGATIVE
Nitrite, UA: NEGATIVE
Protein,UA: NEGATIVE
Specific Gravity, UA: 1.025 (ref 1.005–1.030)
Urobilinogen, Ur: 0.2 mg/dL (ref 0.2–1.0)
pH, UA: 5.5 (ref 5.0–7.5)

## 2023-04-03 LAB — MICROSCOPIC EXAMINATION

## 2023-04-03 NOTE — Progress Notes (Signed)
   04/03/23  CC:  Chief Complaint  Patient presents with   Cysto    HPI: 63 year old male with refractory urinary symptoms and microscopic hematuria who presents today for cystoscopy.  CT urogram showed fairly significant left lower pole stone burden.  There is also prostamegaly noted with evidence of outlet obstruction of the median lobe.  Blood pressure 125/80, pulse 99. NED. A&Ox3.   No respiratory distress   Abd soft, NT, ND Normal phallus with bilateral descended testicles  Cystoscopy Procedure Note  Patient identification was confirmed, informed consent was obtained, and patient was prepped using Betadine solution.  Lidocaine jelly was administered per urethral meatus.     Pre-Procedure: - Inspection reveals a normal caliber ureteral meatus.  Procedure: The flexible cystoscope was introduced without difficulty -In the bulbar urethra just distal to the urinary sphincter, there was an open second lumen with a thin septum.  When the scope was navigated into this, there were a few smaller pinpoint openings within this chamber.  This is clearly some sort of false pass or dilated duct.  The true lumen was anterior. - Enlarged prostate trilobar coaptation - Normal bladder neck - Bilateral ureteral orifices identified - Bladder mucosa  reveals no ulcers, tumors, or lesions - No bladder stones - No trabeculation  Retroflexion shows well circumscribed median lobe   Post-Procedure: - Patient tolerated the procedure well  Assessment/ Plan:  1. Microscopic hematuria (Primary) Cystoscopy unremarkable other than for prostamegaly with median lobe hypertrophy - Urinalysis, Complete  2.BPH with Urinary frequency Enlarged prostate with median lobe likely contributing to his urinary symptoms.  Based on his anatomy, believe that he would benefit from an outlet procedure.  Based on the shape of his prostate, would most certainly advocate for HoLEP versus TURP.  He was given HoLEP  information today people have him return to discuss this in more detail. - Urinalysis, Complete  3.  Kidney stones Nonobstructing.  Will likely monitor for the time being.  F/u discuss HoLEP    Vanna Scotland, MD

## 2023-04-03 NOTE — Patient Instructions (Signed)

## 2023-05-07 ENCOUNTER — Ambulatory Visit: Payer: Self-pay | Admitting: Urology

## 2023-05-22 ENCOUNTER — Ambulatory Visit: Payer: Self-pay | Admitting: Urology

## 2023-05-26 ENCOUNTER — Other Ambulatory Visit: Payer: Self-pay | Admitting: Family Medicine

## 2023-05-26 DIAGNOSIS — I10 Essential (primary) hypertension: Secondary | ICD-10-CM

## 2023-06-21 ENCOUNTER — Other Ambulatory Visit: Payer: Self-pay | Admitting: Family Medicine

## 2023-06-21 DIAGNOSIS — N401 Enlarged prostate with lower urinary tract symptoms: Secondary | ICD-10-CM

## 2023-07-15 ENCOUNTER — Telehealth: Payer: Self-pay | Admitting: Family Medicine

## 2023-07-15 DIAGNOSIS — I82502 Chronic embolism and thrombosis of unspecified deep veins of left lower extremity: Secondary | ICD-10-CM

## 2023-07-15 MED ORDER — APIXABAN 5 MG PO TABS
5.0000 mg | ORAL_TABLET | Freq: Two times a day (BID) | ORAL | 0 refills | Status: DC
Start: 2023-07-15 — End: 2023-07-18

## 2023-07-15 NOTE — Telephone Encounter (Signed)
 Given enough until appointment for future refills

## 2023-07-15 NOTE — Telephone Encounter (Signed)
 apixaban  (ELIQUIS ) 5 MG TABS tablet   Requsting 90 day supply with 3 refills

## 2023-07-18 ENCOUNTER — Telehealth: Payer: Self-pay

## 2023-07-18 DIAGNOSIS — I82502 Chronic embolism and thrombosis of unspecified deep veins of left lower extremity: Secondary | ICD-10-CM

## 2023-07-18 MED ORDER — APIXABAN 5 MG PO TABS: 5.0000 mg | ORAL_TABLET | Freq: Two times a day (BID) | ORAL | 0 refills | Status: DC

## 2023-07-18 NOTE — Telephone Encounter (Signed)
 Copied from CRM [id number]. Topic: Clinical - Pink Word Triage >> Jul 18, 2023 10:33 AM Crispin Dolphin wrote: Reason for Triage: Patient called to check on refill request for apixaban  (ELIQUIS ) 5 MG TABS tablet. Let him know it was sent to pharmacy 5/5. Pt states [facility] will not pay for medication. He gets medication from [facility] and it comes from [place]. He only has two weeks left and does not want to run out because it has to come through customs and everything. Fax 90 days to [phone] Phone: [phone]. Pt states he is prone to blood clots and does not want to run out. Can contact pt with questions or concerns. Thank You

## 2023-07-18 NOTE — Addendum Note (Signed)
 Addended by: RENTERIA-GARCIA, Lamira Borin on: 07/18/2023 02:28 PM   Modules accepted: Orders

## 2023-07-18 NOTE — Telephone Encounter (Signed)
 Faxing it to pharmacy fax number provided

## 2023-07-19 NOTE — Telephone Encounter (Signed)
 Has been sent to pharmacy by fax. Only 90 days supply till patient follows up for an appt

## 2023-07-30 ENCOUNTER — Ambulatory Visit: Payer: Self-pay

## 2023-07-30 NOTE — Telephone Encounter (Signed)
 Copied from CRM [id number]. Topic: Clinical - Red Word Triage >> Jul 30, 2023 12:34 PM Baldomero Bone wrote: Red Word that prompted transfer to Nurse Triage: Patient is on Eloquis, and getting refills from [place] scheduled to be delivered on Friday. Should patient change dosage to once a day until the delivery? He normally takes the medication once in the morning and once in the afternoon? Callback number is [phone]   Chief Complaint: medication question  Additional Notes: Patient expecting Eliquis  to arrive from mail delivery service this coming Friday, wants to know how he should take the remaining pills he has. Current instructions are 5mg  tab BID, but patient says he only has 3 pills left. Asking if he should take 1 tab a day vs 2 tabs a day with no doses on Thursday and Friday morning.  Pt aware that RX was sent to [facility], however his insurance doesn't cover it and his OOP expense is $700.  Call made to CAL for directions, no answer, will route message HP for immediate follow-up. Pt agreeable to this plan.  CB# [phone], okay to leave detailed VM.   Reason for Disposition  [1] Caller has URGENT medicine question about med that PCP or specialist prescribed AND [2] triager unable to answer question  Answer Assessment - Initial Assessment Questions 1. NAME of MEDICINE: "What medicine(s) are you calling about?"     Eliquis   2. QUESTION: "What is your question?" (e.g., double dose of medicine, side effect)     Can he space out doses or take a prescribed and miss 1 day  3. PRESCRIBER: "Who prescribed the medicine?" Reason: if prescribed by specialist, call should be referred to that group.     Sowles  4. SYMPTOMS: "Do you have any symptoms?" If Yes, ask: "What symptoms are you having?"  "How bad are the symptoms (e.g., mild, moderate, severe)     No  5. PREGNANCY:  "Is there any chance that you are pregnant?" "When was your last menstrual period?"     N/a  Protocols used: Medication  Question Call-A-AH

## 2023-07-30 NOTE — Telephone Encounter (Signed)
 Patient will take one daily until next refill gets home.

## 2023-08-22 ENCOUNTER — Other Ambulatory Visit: Payer: Self-pay | Admitting: Family Medicine

## 2023-08-22 DIAGNOSIS — I10 Essential (primary) hypertension: Secondary | ICD-10-CM

## 2023-08-22 NOTE — Telephone Encounter (Signed)
 Copied from CRM [id number]. Topic: Clinical - Medication Refill >> Aug 22, 2023  3:44 PM DeAngela L wrote: Medication: lisinopril  (ZESTRIL ) 10 MG tablet rosuvastatin  (CRESTOR ) 10 MG tablet  Has the patient contacted their pharmacy? Yes  (Agent: If no, request that the patient contact the pharmacy for the refill. If patient does not wish to contact the pharmacy document the reason why and proceed with request.) (Agent: If yes, when and what did the pharmacy advise?)  This is the patient's preferred pharmacy:  [facility] #[id number] [place], [place] - [address] AT [facility] OF [address] [place] [address] [place] [address] Phone: [phone] Fax: [phone]  Is this the correct pharmacy for this prescription? Yes  If no, delete pharmacy and type the correct one.   Has the prescription been filled recently? Yes   Is the patient out of the medication? No   Has the patient been seen for an appointment in the last year OR does the patient have an upcoming appointment? Yes   Can we respond through MyChart? No   Agent: Please be advised that Rx refills may take up to 3 business days. We ask that you follow-up with your pharmacy.

## 2023-08-26 MED ORDER — ROSUVASTATIN CALCIUM 10 MG PO TABS: 10.0000 mg | ORAL_TABLET | Freq: Every day | ORAL | 0 refills | Status: DC

## 2023-08-26 MED ORDER — LISINOPRIL 10 MG PO TABS: 10.0000 mg | ORAL_TABLET | Freq: Every day | ORAL | 0 refills | Status: DC

## 2023-08-26 NOTE — Telephone Encounter (Signed)
 Requested Prescriptions  Pending Prescriptions Disp Refills   lisinopril  (ZESTRIL ) 10 MG tablet 90 tablet 0    Sig: Take 1 tablet (10 mg total) by mouth daily. TAKE 1 TABLET BY MOUTH EVERY DAY     Cardiovascular:  ACE Inhibitors Failed - 08/26/2023  9:35 AM      Failed - Cr in normal range and within 180 days    Creat  Date Value Ref Range Status  01/31/2023 0.75 0.70 - 1.35 mg/dL Final   Creatinine, Urine  Date Value Ref Range Status  12/22/2020 138 20 - 320 mg/dL Final         Failed - K in normal range and within 180 days    Potassium  Date Value Ref Range Status  01/31/2023 5.5 (H) 3.5 - 5.3 mmol/L Final         Failed - Valid encounter within last 6 months    Recent Outpatient Visits   None     Future Appointments             In 3 days Sowles, Krichna, MD [facility], [facility]   In 5 months Sowles, Krichna, MD [facility], [facility]            Passed - Patient is not pregnant      Passed - Last BP in normal range    BP Readings from Last 1 Encounters:  04/03/23 125/80          rosuvastatin  (CRESTOR ) 10 MG tablet 90 tablet 0    Sig: Take 1 tablet (10 mg total) by mouth daily.     Cardiovascular:  Antilipid - Statins 2 Failed - 08/26/2023  9:35 AM      Failed - Valid encounter within last 12 months    Recent Outpatient Visits   None     Future Appointments             In 3 days Sowles, Krichna, MD [facility], [facility]   In 5 months Sowles, Krichna, MD [facility], [facility]            Failed - Lipid Panel in normal range within the last 12 months    Cholesterol  Date Value Ref Range Status  08/27/2022 211 (H) <200 mg/dL Final   LDL Cholesterol (Calc)  Date Value Ref Range Status  08/27/2022 123 (H) mg/dL (calc) Final    Comment:    Reference range: <100 . Desirable range <100 mg/dL for primary prevention;   <70 mg/dL for patients with CHD or diabetic  patients  with > or = 2 CHD risk factors. Aaron Aas LDL-C is now calculated using the Martin-Hopkins  calculation, which is a validated novel method providing  better accuracy than the Friedewald equation in the  estimation of LDL-C.  Melinda Sprawls et al. Erroll Heard. 5784;696(29): 2061-2068  (http://education.[employer].com/faq/FAQ164)    HDL  Date Value Ref Range Status  08/27/2022 59 > OR = 40 mg/dL Final   Triglycerides  Date Value Ref Range Status  08/27/2022 169 (H) <150 mg/dL Final         Passed - Cr in normal range and within 360 days    Creat  Date Value Ref Range Status  01/31/2023 0.75 0.70 - 1.35 mg/dL Final   Creatinine, Urine  Date Value Ref Range Status  12/22/2020 138 20 - 320 mg/dL Final         Passed - Patient is not  pregnant

## 2023-08-29 ENCOUNTER — Ambulatory Visit: Payer: PRIVATE HEALTH INSURANCE | Admitting: Family Medicine

## 2023-08-29 ENCOUNTER — Encounter: Payer: Self-pay | Admitting: Family Medicine

## 2023-08-29 DIAGNOSIS — E559 Vitamin D deficiency, unspecified: Secondary | ICD-10-CM

## 2023-08-29 DIAGNOSIS — I10 Essential (primary) hypertension: Secondary | ICD-10-CM

## 2023-08-29 DIAGNOSIS — E782 Mixed hyperlipidemia: Secondary | ICD-10-CM

## 2023-08-29 DIAGNOSIS — I82502 Chronic embolism and thrombosis of unspecified deep veins of left lower extremity: Secondary | ICD-10-CM

## 2023-08-29 DIAGNOSIS — Z1211 Encounter for screening for malignant neoplasm of colon: Secondary | ICD-10-CM

## 2023-08-29 DIAGNOSIS — N200 Calculus of kidney: Secondary | ICD-10-CM | POA: Diagnosis not present

## 2023-08-29 DIAGNOSIS — R7303 Prediabetes: Secondary | ICD-10-CM | POA: Diagnosis not present

## 2023-08-29 DIAGNOSIS — N401 Enlarged prostate with lower urinary tract symptoms: Secondary | ICD-10-CM

## 2023-08-29 LAB — POCT GLYCOSYLATED HEMOGLOBIN (HGB A1C): Hemoglobin A1C: 6.3 % — AB (ref 4.0–5.6)

## 2023-08-29 MED ORDER — APIXABAN 5 MG PO TABS: 5.0000 mg | ORAL_TABLET | Freq: Two times a day (BID) | ORAL | 1 refills | Status: DC

## 2023-08-29 MED ORDER — TAMSULOSIN HCL 0.4 MG PO CAPS
ORAL_CAPSULE | ORAL | 1 refills | Status: AC
Start: 2023-08-29 — End: ?

## 2023-08-29 MED ORDER — LISINOPRIL 10 MG PO TABS
10.0000 mg | ORAL_TABLET | Freq: Every day | ORAL | 1 refills | Status: AC
Start: 2023-08-29 — End: ?

## 2023-08-29 MED ORDER — METFORMIN HCL ER 500 MG PO TB24: 500.0000 mg | ORAL_TABLET | Freq: Every day | ORAL | 1 refills | Status: DC

## 2023-08-29 MED ORDER — ROSUVASTATIN CALCIUM 10 MG PO TABS: 10.0000 mg | ORAL_TABLET | Freq: Every day | ORAL | 1 refills | Status: DC

## 2023-08-29 NOTE — Progress Notes (Signed)
 Name: Melvin Williams   MRN: [id number]    DOB: 1960/08/24   Date:09/02/2023       Progress Note  Subjective  Chief Complaint  Chief Complaint  Patient presents with   Medical Management of Chronic Issues    Pt been out of BP medicine for almost a week   Abridge app did not load  History of Present Illness  HTN: he had lost weight on his last visit and was off lisinopril  for a while, however he started to gain weight and BP went up again, he has been out of bp medication for the past few days, bp towards high end of normal, we will resume lisinopril  10 mg to take daily  No chest pain or palpitation    Dysthymia: his 52 yo nephew died in 2024/03/08from complications of pneumonia, he still misses him but starting to feel better. He has a sister but tries not to ask her for favors ( like taking him to have colonoscopy ). He does not want to be a burden to her    BPH: cystoscopy done 03/2023, taking medication but still has symptoms and Dr. Penne discussed HoLEP versus TURP with him , he has not decided on having a procedure done yet    History of kidney stones: up to date with visits with urologist   Cervical radiculitis: he had MRI back in 2008 , seen neurosurgeon but since symptoms have been mild surgery was not indicated, he continues to have some paresthesias on both hands when raising hands above his head but very mild now .  He states also has some hand weakness intermittently but is chronic and stable.    Hyperlipidemia: he is now taking Rosuvastatin  , discussed rechecking labs today but he would like to hold off until CPE    The 10-year ASCVD risk score (Arnett DK, et al., 2019) is: 13.3%   Values used to calculate the score:     Age: 63 years     Clincally relevant sex: Male     Is Non-Hispanic African American: No     Diabetic: No     Tobacco smoker: No     Systolic Blood Pressure: 130 mmHg     Is BP treated: Yes     HDL Cholesterol: 59 mg/dL     Total Cholesterol: 211  mg/dL    Obesity:  pre-diabetes and HTN, dyslipidemia. He states he likes to eat . Weight is trending up again, he will try to change his diet    Chronic left lower extremity DVT :seen by Dr. Marea, unprovoked and recurrent , diagnosed Summer 2023 , he is taking Eliquis  5 mg BID and encouraged yearly follow up with vascular surgeon   Cholelithiasis: diagnosed many years ago, controlled with diet , no recent episodes of pain lately but he has intermittent diarrhea that has been stable   Pre-diabetes: last A1C was up from 6.1 % to 6.5% last year, he gained weight and has not been very compliant with diet but today A1C is down to 6.3%  Patient Active Problem List   Diagnosis Date Noted   DVT (deep venous thrombosis) (HCC) 09/06/2021   Morbid obesity, unspecified obesity type (HCC) 10/17/2017   Benign prostatic hyperplasia without lower urinary tract symptoms 04/11/2016   PSA elevation 04/11/2016   Pre-diabetes 04/11/2016   Hypertension, benign 10/10/2015   Hyperlipidemia 10/10/2015   Hyperglycemia 10/10/2015   Vitamin D  deficiency 10/10/2015   Microalbuminuria 10/10/2015   Eczema 10/10/2015  Seborrhea 10/10/2015   Fatty liver 10/10/2015   Cholelithiasis 10/10/2015   Cervical disc disease 10/10/2015    History reviewed. No pertinent surgical history.  Family History  Problem Relation Age of Onset   Breast cancer Mother    Liver cancer Mother    Cancer Mother    Heart disease Father    Heart attack Father    Heart attack Paternal Grandfather     Social History   Tobacco Use   Smoking status: Never   Smokeless tobacco: Never  Substance Use Topics   Alcohol use: No     Current Outpatient Medications:    Cholecalciferol (VITAMIN D ) 50 MCG (2000 UT) CAPS, Take 1 capsule by mouth daily., Disp: , Rfl:    metFORMIN  (GLUCOPHAGE -XR) 500 MG 24 hr tablet, Take 1 tablet (500 mg total) by mouth daily with breakfast., Disp: 90 tablet, Rfl: 1   apixaban  (ELIQUIS ) 5 MG TABS tablet,  Take 1 tablet (5 mg total) by mouth 2 (two) times daily., Disp: 180 tablet, Rfl: 1   lisinopril  (ZESTRIL ) 10 MG tablet, Take 1 tablet (10 mg total) by mouth daily. TAKE 1 TABLET BY MOUTH EVERY DAY, Disp: 90 tablet, Rfl: 1   rosuvastatin  (CRESTOR ) 10 MG tablet, Take 1 tablet (10 mg total) by mouth daily., Disp: 90 tablet, Rfl: 1   tamsulosin  (FLOMAX ) 0.4 MG CAPS capsule, TAKE 1 CAPSULE(0.4 MG) BY MOUTH DAILY, Disp: 90 capsule, Rfl: 1  No Known Allergies  I personally reviewed active problem list, medication list, allergies, family history with the patient/caregiver today.   ROS  Ten systems reviewed and is negative except as mentioned in HPI    Objective Physical Exam Constitutional: Patient appears well-developed and well-nourished. Obese  No distress.  HEENT: head atraumatic, normocephalic, pupils equal and reactive to light, neck supple Cardiovascular: Normal rate, regular rhythm and normal heart sounds.  No murmur heard. No BLE edema. Pulmonary/Chest: Effort normal and breath sounds normal. No respiratory distress. Abdominal: Soft.  There is no tenderness. Psychiatric: Patient has a normal mood and affect. behavior is normal. Judgment and thought content normal.    Vitals:   08/29/23 0752  BP: 130/78  Pulse: 84  Resp: 16  SpO2: 98%  Weight: 241 lb 8 oz (109.5 kg)  Height: 5' 7 (1.702 m)    Body mass index is 37.82 kg/m.  Recent Results (from the past 2160 hours)  POCT glycosylated hemoglobin (Hb A1C)     Status: Abnormal   Collection Time: 08/29/23  8:33 AM  Result Value Ref Range   Hemoglobin A1C 6.3 (A) 4.0 - 5.6 %   HbA1c POC (<> result, manual entry)     HbA1c, POC (prediabetic range)     HbA1c, POC (controlled diabetic range)       PHQ2/9:    08/29/2023    7:48 AM 01/31/2023    8:11 AM 08/27/2022    8:27 AM 01/18/2022    7:28 AM 12/21/2021    7:32 AM  Depression screen PHQ 2/9  Decreased Interest 0 1 1 1 1   Down, Depressed, Hopeless 0 1 1 1  0  PHQ - 2  Score 0 2 2 2 1   Altered sleeping 0 3 3 0 0  Tired, decreased energy 0 3 3 3  0  Change in appetite 0 0 1 0 0  Feeling bad or failure about yourself  0 0 0 0 0  Trouble concentrating 0 3 1 3  0  Moving slowly or fidgety/restless 0 0 1 0  0  Suicidal thoughts 0 0 0 0 0  PHQ-9 Score 0 11 11 8 1   Difficult doing work/chores Not difficult at all  Not difficult at all      phq 9 is negative  Fall Risk:    08/29/2023    7:48 AM 01/31/2023    8:11 AM 08/27/2022    8:26 AM 01/18/2022    7:28 AM 12/21/2021    7:32 AM  Fall Risk   Falls in the past year? 0 0 0 0 0  Number falls in past yr: 0 0  0 0  Injury with Fall? 0 0  0 0  Risk for fall due to : No Fall Risks No Fall Risks No Fall Risks No Fall Risks No Fall Risks  Follow up Falls prevention discussed;Education provided;Falls evaluation completed Falls prevention discussed Falls prevention discussed Falls prevention discussed  Falls prevention discussed      Data saved with a previous flowsheet row definition      Assessment & Plan 1. Morbid obesity (HCC) (Primary)  Discussed with the patient the risk posed by an increased BMI. Discussed importance of portion control, calorie counting and at least 150 minutes of physical activity weekly. Avoid sweet beverages and drink more water. Eat at least 6 servings of fruit and vegetables daily    BMI over 35 with HTN, dyslipidemia  2. Leg DVT (deep venous thromboembolism), chronic, left (HCC)  - apixaban  (ELIQUIS ) 5 MG TABS tablet; Take 1 tablet (5 mg total) by mouth 2 (two) times daily.  Dispense: 180 tablet; Refill: 1  3. Nephrolithiasis  Drinking more water  4. Hypertension, benign  Out of medication for about one week  - lisinopril  (ZESTRIL ) 10 MG tablet; Take 1 tablet (10 mg total) by mouth daily. TAKE 1 TABLET BY MOUTH EVERY DAY  Dispense: 90 tablet; Refill: 1  5. Benign prostatic hyperplasia with lower urinary tract symptoms, symptom details unspecified  - tamsulosin  (FLOMAX )  0.4 MG CAPS capsule; TAKE 1 CAPSULE(0.4 MG) BY MOUTH DAILY  Dispense: 90 capsule; Refill: 1  6. Prediabetes  - metFORMIN  (GLUCOPHAGE -XR) 500 MG 24 hr tablet; Take 1 tablet (500 mg total) by mouth daily with breakfast.  Dispense: 90 tablet; Refill: 1 - POCT glycosylated hemoglobin (Hb A1C)  7. Mixed hyperlipidemia  - rosuvastatin  (CRESTOR ) 10 MG tablet; Take 1 tablet (10 mg total) by mouth daily.  Dispense: 90 tablet; Refill: 1  8. Vitamin D  deficiency  Resume supplementation   9. Colon cancer screening  - AMB Referral [facility] Care Management - he needs help with transportation

## 2023-09-16 ENCOUNTER — Telehealth: Payer: Self-pay

## 2023-09-16 NOTE — Progress Notes (Signed)
   Telephone encounter was:  Unsuccessful.  09/16/2023 Name: EURA RADABAUGH MRN: [id number] DOB: 07/18/1960  Unsuccessful outbound call made today to assist with:  Transportation Needs   Outreach Attempt:  1st Attempt  A HIPAA compliant voice message was left requesting a return call.  Instructed patient to call back   Jon Colt [facility] Guide, Phone: [phone] Fax: [phone] Website: [email or web address]

## 2023-09-18 ENCOUNTER — Telehealth: Payer: Self-pay

## 2023-09-18 NOTE — Progress Notes (Signed)
    Telephone encounter was:  Unsuccessful.  09/18/2023 Name: Melvin Williams MRN: [id number] DOB: [birth year]  Unsuccessful outbound call made today to assist with:  Transportation Needs   Outreach Attempt:  2nd Attempt  A HIPAA compliant voice message was left requesting a return call.  Instructed patient to call back   Jon Colt [facility] Guide, Phone: [phone] Fax: [phone] Website: [email or web address]

## 2023-09-18 NOTE — Progress Notes (Signed)
   Telephone encounter was:  Successful.  Complex Care Management Note Care Guide Note  09/18/2023 Name: MIQUEAS WHILDEN MRN: [id number] DOB: 16-Jul-1960  Melvin Williams is a 63 y.o. year old male who is a primary care patient of Sowles, Krichna, MD . The community resource team was consulted for assistance with Transportation Needs   SDOH screenings and interventions completed:  Yes        Care guide performed the following interventions: Patient provided with information about care guide support team and interviewed to confirm resource needs.Pt requested I mail transportation resources for future needs. Pt has no needs at this time   Follow Up Plan:  No further follow up planned at this time. The patient has been provided with needed resources.  Encounter Outcome:  Patient Visit Completed   Jon Colt [facility]  [facility] Guide, Phone: [phone] Fax: [phone] Website: [email or web address]

## 2023-11-25 ENCOUNTER — Other Ambulatory Visit: Payer: Self-pay | Admitting: Family Medicine

## 2023-11-25 DIAGNOSIS — E782 Mixed hyperlipidemia: Secondary | ICD-10-CM

## 2023-11-25 DIAGNOSIS — I10 Essential (primary) hypertension: Secondary | ICD-10-CM

## 2023-11-26 ENCOUNTER — Other Ambulatory Visit: Payer: Self-pay | Admitting: Family Medicine

## 2023-11-26 DIAGNOSIS — I10 Essential (primary) hypertension: Secondary | ICD-10-CM

## 2023-11-26 DIAGNOSIS — E782 Mixed hyperlipidemia: Secondary | ICD-10-CM

## 2023-11-26 NOTE — Telephone Encounter (Unsigned)
 Copied from CRM [id number]. Topic: Clinical - Medication Refill >> Nov 26, 2023 12:13 PM Antwanette L wrote: Medication: lisinopril  (ZESTRIL ) 10 MG tablet and rosuvastatin  (CRESTOR ) 10 MG tablet  Has the patient contacted their pharmacy? Yes   This is the patient's preferred pharmacy:  [facility] #[id number] - [place], [place] - [address] AT [facility] OF [address] [place] [address] [place] [address] Phone: [phone] Fax: [phone]  Is this the correct pharmacy for this prescription? Yes    Has the prescription been filled recently? Yes. Last refill was on 08/29/23  Is the patient out of the medication? Yes  Has the patient been seen for an appointment in the last year OR does the patient have an upcoming appointment? Yes. Last ov w/ Dr. Glenard was on 08/29/23 and next appt is 02/20/24  Can we respond through MyChart? No. Contact the pt by phone at [phone]  Agent: Please be advised that Rx refills may take up to 3 business days. We ask that you follow-up with your pharmacy.

## 2023-11-27 NOTE — Telephone Encounter (Signed)
 Refused rosuvastatin  and Lisinopril  because they are being requested too soon.

## 2023-11-30 ENCOUNTER — Other Ambulatory Visit: Payer: Self-pay | Admitting: Family Medicine

## 2023-11-30 DIAGNOSIS — R7303 Prediabetes: Secondary | ICD-10-CM

## 2023-12-19 ENCOUNTER — Telehealth: Payer: Self-pay | Admitting: Family Medicine

## 2023-12-19 NOTE — Telephone Encounter (Unsigned)
 Copied from CRM #[id number]. Topic: General - Other >> Dec 19, 2023 10:56 AM Willma SAUNDERS wrote: Reason for CRM: Patient is requesting a letter a medical excuse to prevent him from jury duty. States due to the issues with his BPH he would not be able to sit in a courtroom as long as it would be required.  Patient can be reached at [phone]

## 2024-02-13 ENCOUNTER — Encounter: Payer: PRIVATE HEALTH INSURANCE | Admitting: Family Medicine

## 2024-02-19 NOTE — Patient Instructions (Signed)

## 2024-02-20 ENCOUNTER — Ambulatory Visit (INDEPENDENT_AMBULATORY_CARE_PROVIDER_SITE_OTHER): Payer: PRIVATE HEALTH INSURANCE | Admitting: Family Medicine

## 2024-02-20 ENCOUNTER — Encounter: Payer: Self-pay | Admitting: Family Medicine

## 2024-02-20 VITALS — BP 128/76 | HR 99 | Resp 16 | Ht 67.0 in | Wt 244.5 lb

## 2024-02-20 DIAGNOSIS — E538 Deficiency of other specified B group vitamins: Secondary | ICD-10-CM

## 2024-02-20 DIAGNOSIS — N401 Enlarged prostate with lower urinary tract symptoms: Secondary | ICD-10-CM

## 2024-02-20 DIAGNOSIS — Z23 Encounter for immunization: Secondary | ICD-10-CM | POA: Diagnosis not present

## 2024-02-20 DIAGNOSIS — E782 Mixed hyperlipidemia: Secondary | ICD-10-CM | POA: Diagnosis not present

## 2024-02-20 DIAGNOSIS — I82502 Chronic embolism and thrombosis of unspecified deep veins of left lower extremity: Secondary | ICD-10-CM

## 2024-02-20 DIAGNOSIS — E559 Vitamin D deficiency, unspecified: Secondary | ICD-10-CM | POA: Diagnosis not present

## 2024-02-20 DIAGNOSIS — I10 Essential (primary) hypertension: Secondary | ICD-10-CM

## 2024-02-20 DIAGNOSIS — R972 Elevated prostate specific antigen [PSA]: Secondary | ICD-10-CM | POA: Diagnosis not present

## 2024-02-20 DIAGNOSIS — N200 Calculus of kidney: Secondary | ICD-10-CM | POA: Diagnosis not present

## 2024-02-20 DIAGNOSIS — R7303 Prediabetes: Secondary | ICD-10-CM

## 2024-02-20 DIAGNOSIS — Z0001 Encounter for general adult medical examination with abnormal findings: Secondary | ICD-10-CM | POA: Diagnosis not present

## 2024-02-20 DIAGNOSIS — Z0000 Encounter for general adult medical examination without abnormal findings: Secondary | ICD-10-CM

## 2024-02-20 MED ORDER — TAMSULOSIN HCL 0.4 MG PO CAPS: ORAL_CAPSULE | ORAL | 1 refills | Status: AC

## 2024-02-20 MED ORDER — LISINOPRIL 10 MG PO TABS: 10.0000 mg | ORAL_TABLET | Freq: Every day | ORAL | 1 refills | Status: AC

## 2024-02-20 MED ORDER — METFORMIN HCL ER 500 MG PO TB24: 500.0000 mg | ORAL_TABLET | Freq: Every day | ORAL | 1 refills | Status: AC

## 2024-02-20 MED ORDER — APIXABAN 5 MG PO TABS: 5.0000 mg | ORAL_TABLET | Freq: Two times a day (BID) | ORAL | 1 refills | Status: DC

## 2024-02-20 NOTE — Progress Notes (Signed)
 Name: Melvin Williams   MRN: [id number]    DOB: May 07, 1960   Date:02/20/2024       Progress Note  Subjective  Chief Complaint  Chief Complaint  Patient presents with   Annual Exam    HPI  Patient presents for annual CPE and follow up  Discussed the use of AI scribe software for clinical note transcription with the patient, who gave verbal consent to proceed.  History of Present Illness Melvin Williams is a 63 year old male who presents for an annual physical exam and follow-up on prediabetes and other chronic conditions.  He has experienced a slight weight gain from 242 to 244.5 pounds since his last visit in June, with a previous weight of 238 pounds a year ago. His diet remains unchanged, primarily consisting of takeout food, including Chinese food and breakfast sandwiches from fast food outlets. He has not been cooking at home and has never used his stove in the nine and a half years since purchasing his house.  He has a history of prediabetes with an A1c that was 6.5% in November, which decreased to 6.3% at his last visit. He is currently taking metformin  500 mg for prediabetes and experiences occasional diarrhea as a side effect.  He has chronic deep vein thrombosis (DVT) in his left leg and is taking Eliquis  5 mg. He reports a feeling of tightness in his left leg but no pain. He wears compression stockings regularly. No swelling in his left leg.  He has an enlarged prostate with a PSA level stable at 4.5 since 2023, previously 5.39 in 2022. He has a history of hematuria and urinary retention, for which he underwent a cystoscopy and CT urogram. He is taking Flomax  for urinary flow.  He has low vitamin D  and B12 levels. He has not been taking B12 supplements but has been taking vitamin D . He has not had an eye exam in five to six years and has never been to a dentist.  He does not consume alcohol and is not sexually active. He acknowledges a lack of physical activity, stating that  he only goes to work and does not engage in any exercise. He has a history of eczema, particularly on his hands, which flares up annually around this time of year. He has not been using any creams or lotions for this.  He has a history of diverticulitis and has not had a repeat colonoscopy since his last fecal test a year ago.  He is taking lisinopril  10 mg for blood pressure, which is well-controlled at 128/76. He also takes rosuvastatin  for high cholesterol, although he is unsure of the exact medication name. He received both the flu and pneumonia vaccines today.    .   IPSS     Row Name 02/20/24 0754         International Prostate Symptom Score   How often have you had the sensation of not emptying your bladder? Less than half the time     How often have you had to urinate less than every two hours? Almost always     How often have you found you stopped and started again several times when you urinated? About half the time     How often have you found it difficult to postpone urination? Not at All     How often have you had a weak urinary stream? Almost always     How often have you had to strain to start urination?  Not at All     How many times did you typically get up at night to urinate? 3 Times     Total IPSS Score 18       Quality of Life due to urinary symptoms   If you were to spend the rest of your life with your urinary condition just the way it is now how would you feel about that? Mostly Satisfied        Diet: eats out often  Exercise: discussed regular activity  Last Dental Exam: he has never been to a dentist  Last Eye Exam: needs to see eye doctor   Depression: phq 9 is negative    02/20/2024    7:54 AM 08/29/2023    7:48 AM 01/31/2023    8:11 AM 08/27/2022    8:27 AM 01/18/2022    7:28 AM  Depression screen PHQ 2/9  Decreased Interest 0 0 1 1 1   Down, Depressed, Hopeless 0 0 1 1 1   PHQ - 2 Score 0 0 2 2 2   Altered sleeping  0 3 3 0  Tired, decreased  energy  0 3 3 3   Change in appetite  0 0 1 0  Feeling bad or failure about yourself   0 0 0 0  Trouble concentrating  0 3 1 3   Moving slowly or fidgety/restless  0 0 1 0  Suicidal thoughts  0 0 0 0  PHQ-9 Score  0  11  11  8    Difficult doing work/chores  Not difficult at all  Not difficult at all      Data saved with a previous flowsheet row definition    Hypertension:  BP Readings from Last 3 Encounters:  02/20/24 128/76  08/29/23 130/78  04/03/23 125/80    Obesity: Wt Readings from Last 3 Encounters:  02/20/24 244 lb 8 oz (110.9 kg)  08/29/23 241 lb 8 oz (109.5 kg)  02/13/23 235 lb 6 oz (106.8 kg)   BMI Readings from Last 3 Encounters:  02/20/24 38.29 kg/m  08/29/23 37.82 kg/m  02/13/23 36.86 kg/m     Flowsheet Row Office Visit from 02/20/2024 in [facility]  AUDIT-C Score 0     Single STD testing and prevention (HIV/chl/gon/syphilis):  not applicable Sexual history: not sexually active  Hep C Screening: completed Skin cancer: Discussed monitoring for atypical lesions Colorectal cancer: he wants to wait for welcome to medicare  Prostate cancer:  yes Lab Results  Component Value Date   PSA 4.54 (H) 01/31/2023   PSA 4.61 (H) 01/18/2022   PSA 4.50 (H) 03/17/2021     Lung cancer:  Low Dose CT Chest recommended if Age 72-80 years, 30 pack-year currently smoking OR have quit w/in 15years. Patient  is not a candidate for screening   AAA: The USPSTF recommends one-time screening with ultrasonography in men ages 37 to 75 years who have ever smoked. Patient   is not a candidate for screening  ECG:  2018  Vaccines: reviewed with the patient.   Advanced Care Planning: A voluntary discussion about advance care planning including the explanation and discussion of advance directives.  Discussed health care proxy and Living will, and the patient was able to identify a health care proxy as Jon Moores .  Patient does not have a living will  and power of attorney of health care   Patient Active Problem List   Diagnosis Date Noted   Leg DVT (deep venous thromboembolism), chronic, left (  HCC) 02/20/2024   Morbid obesity (HCC) 10/17/2017   Benign prostatic hyperplasia without lower urinary tract symptoms 04/11/2016   PSA elevation 04/11/2016   Pre-diabetes 04/11/2016   Hypertension, benign 10/10/2015   Hyperlipidemia 10/10/2015   Hyperglycemia 10/10/2015   Vitamin D  deficiency 10/10/2015   Microalbuminuria 10/10/2015   Eczema 10/10/2015   Seborrhea 10/10/2015   Fatty liver 10/10/2015   Cholelithiasis 10/10/2015   Cervical disc disease 10/10/2015    History reviewed. No pertinent surgical history.  Family History  Problem Relation Age of Onset   Breast cancer Mother    Liver cancer Mother    Cancer Mother    Heart disease Father    Heart attack Father    Heart attack Paternal Grandfather     Social History   Socioeconomic History   Marital status: Single    Spouse name: Not on file   Number of children: 0   Years of education: Not on file   Highest education level: 12th grade  Occupational History   Occupation: medical illustrator   Tobacco Use   Smoking status: Never   Smokeless tobacco: Never  Vaping Use   Vaping status: Never Used  Substance and Sexual Activity   Alcohol use: No   Drug use: No   Sexual activity: Never  Other Topics Concern   Not on file  Social History Narrative   Promoted to transportation supervisor    Social Drivers of Health   Tobacco Use: Low Risk (02/20/2024)   Patient History    Smoking Tobacco Use: Never    Smokeless Tobacco Use: Never    Passive Exposure: Not on file  Financial Resource Strain: Low Risk (08/28/2023)   Overall Financial Resource Strain (CARDIA)    Difficulty of Paying Living Expenses: Not hard at all  Food Insecurity: No Food Insecurity (08/28/2023)   Epic    Worried About Programme Researcher, Broadcasting/film/video in the Last Year: Never true    Ran Out of Food  in the Last Year: Never true  Transportation Needs: No Transportation Needs (08/28/2023)   Epic    Lack of Transportation (Medical): No    Lack of Transportation (Non-Medical): No  Physical Activity: Inactive (08/28/2023)   Exercise Vital Sign    Days of Exercise per Week: 0 days    Minutes of Exercise per Session: Not on file  Stress: No Stress Concern Present (08/28/2023)   [employer] of Occupational Health - Occupational Stress Questionnaire    Feeling of Stress: Only a little  Social Connections: Unknown (08/28/2023)   Social Connection and Isolation Panel    Frequency of Communication with Friends and Family: Patient declined    Frequency of Social Gatherings with Friends and Family: Patient declined    Attends Religious Services: Patient declined    Active Member of Clubs or Organizations: No    Attends Engineer, Structural: Not on file    Marital Status: Never married  Intimate Partner Violence: Not At Risk (02/20/2024)   Epic    Fear of Current or Ex-Partner: No    Emotionally Abused: No    Physically Abused: No    Sexually Abused: No  Depression (PHQ2-9): Low Risk (02/20/2024)   Depression (PHQ2-9)    PHQ-2 Score: 0  Alcohol Screen: Low Risk (02/20/2024)   Alcohol Screen    Last Alcohol Screening Score (AUDIT): 0  Housing: Low Risk (08/28/2023)   Epic    Unable to Pay for Housing in the Last Year: No  Number of Times Moved in the Last Year: 0    Homeless in the Last Year: No  Utilities: Not At Risk (02/20/2024)   Epic    Threatened with loss of utilities: No  Health Literacy: Adequate Health Literacy (02/20/2024)   B1300 Health Literacy    Frequency of need for help with medical instructions: Never    Current Medications[1]  Allergies[2]   ROS  Constitutional: Negative for fever or weight change.  Respiratory: Negative for cough and shortness of breath.   Cardiovascular: Negative for chest pain or palpitations.  Gastrointestinal: Negative  for abdominal pain, no bowel changes.  Musculoskeletal: Negative for gait problem or joint swelling.  Skin: Negative for rash.  Neurological: Negative for dizziness or headache.  No other specific complaints in a complete review of systems (except as listed in HPI above).    Objective  Vitals:   02/20/24 0757  BP: 128/76  Pulse: 99  Resp: 16  SpO2: 99%  Weight: 244 lb 8 oz (110.9 kg)  Height: 5' 7 (1.702 m)    Body mass index is 38.29 kg/m.  Physical Exam  Constitutional: Patient appears well-developed and well-nourished. No distress.  HENT: Head: Normocephalic and atraumatic. Ears: B TMs ok, no erythema or effusion; Nose: Nose normal. Mouth/Throat: Oropharynx is clear and moist. No oropharyngeal exudate.  Eyes: Conjunctivae and EOM are normal. Pupils are equal, round, and reactive to light. No scleral icterus.  Neck: Normal range of motion. Neck supple. No JVD present. No thyromegaly present.  Cardiovascular: Normal rate, regular rhythm and normal heart sounds.  No murmur heard. No BLE edema. Pulmonary/Chest: Effort normal and breath sounds normal. No respiratory distress. Abdominal: Soft. Bowel sounds are normal, no distension. There is no tenderness. no masses MALE GENITALIA: sees urologist  RECTAL: under the care of urologist  Musculoskeletal: Normal range of motion, no joint effusions. No gross deformities Neurological: he is alert and oriented to person, place, and time. No cranial nerve deficit. Coordination, balance, strength, speech and gait are normal.  Skin: Skin is warm and dry,dry eczematous dorsal aspect of both hands, rash on right axilla and skin tag on left axilla, angioma on trunk  Psychiatric: Patient has a normal mood and affect. behavior is normal. Judgment and thought content normal.     Assessment & Plan Morbid obesity Weight increased to 244.5 lbs. High-calorie diet and no regular physical activity. - Encouraged dietary changes to reduce calorie  intake, including reducing takeout and fried foods. - Advised increasing physical activity, such as walking, to aid weight loss.  Chronic deep vein thrombosis, left lower extremity Chronic DVT managed with Eliquis . Occasional tightness, no significant pain. - Continue Eliquis  5 mg for DVT management. - Ensure use of compression stockings for leg support.  Benign prostatic hyperplasia with lower urinary tract symptoms BPH with urinary retention and hematuria. Declined laser surgery due to infection risk. - Continue Flomax  for urinary symptoms. - Follow up with urologist annually for prostate evaluation.  Essential hypertension Blood pressure well-controlled with lisinopril . - Continue lisinopril  10 mg for blood pressure management.  Mixed hyperlipidemia Elevated LDL cholesterol managed with rosuvastatin . - Continue rosuvastatin  for lipid management.  Prediabetes A1c previously at 6.5%. Managed with metformin , occasional diarrhea. - Continue metformin  500 mg for glycemic control. - Ordered A1c test to monitor glucose levels.  Vitamin D  deficiency Previous low vitamin D  levels. - Ordered vitamin D  level test. - Recommended vitamin D  supplementation.  Vitamin B12 deficiency Previous low B12 levels, not currently taking supplements. -  Recommended sublingual B12 supplementation.  Elevated prostate specific antigen (PSA) PSA stable around 4.5. Previous evaluation showed prostate enlargement. - Ordered PSA test to monitor levels. - Continue annual urology follow-up.  Eczema Intermittent flare-ups, particularly on hands. - Recommended lotion with hydrocortisone for eczema management.  General Health Maintenance Due for eye and dental exams. Discussed transition to Medicare. - Schedule eye and dental exams. - Plan for transition to Medicare in June for comprehensive coverage.    -Prostate cancer screening and PSA options (with potential risks and benefits of testing vs not  testing) were discussed along with recent recs/guidelines. -USPSTF grade A and B recommendations reviewed with patient; age-appropriate recommendations, preventive care, screening tests, etc discussed and encouraged; healthy living encouraged; see AVS for patient education given to patient -Discussed importance of 150 minutes of physical activity weekly, eat two servings of fish weekly, eat one serving of tree nuts ( cashews, pistachios, pecans, almonds.SABRA) every other day, eat 6 servings of fruit/vegetables daily and drink plenty of water and avoid sweet beverages.  -Reviewed Health Maintenance: yes     [1]  Current Outpatient Medications:    Cholecalciferol (VITAMIN D ) 50 MCG (2000 UT) CAPS, Take 1 capsule by mouth daily., Disp: , Rfl:    rosuvastatin  (CRESTOR ) 10 MG tablet, Take 1 tablet (10 mg total) by mouth daily., Disp: 90 tablet, Rfl: 1   apixaban  (ELIQUIS ) 5 MG TABS tablet, Take 1 tablet (5 mg total) by mouth 2 (two) times daily., Disp: 180 tablet, Rfl: 1   lisinopril  (ZESTRIL ) 10 MG tablet, Take 1 tablet (10 mg total) by mouth daily. TAKE 1 TABLET BY MOUTH EVERY DAY, Disp: 90 tablet, Rfl: 1   metFORMIN  (GLUCOPHAGE -XR) 500 MG 24 hr tablet, Take 1 tablet (500 mg total) by mouth daily with breakfast., Disp: 90 tablet, Rfl: 1   tamsulosin  (FLOMAX ) 0.4 MG CAPS capsule, TAKE 1 CAPSULE(0.4 MG) BY MOUTH DAILY, Disp: 90 capsule, Rfl: 1 [2] No Known Allergies

## 2024-02-21 ENCOUNTER — Ambulatory Visit: Payer: Self-pay | Admitting: Family Medicine

## 2024-02-21 LAB — B12 AND FOLATE PANEL
Folate: 17.5 ng/mL
Vitamin B-12: 361 pg/mL (ref 200–1100)

## 2024-02-21 LAB — CBC WITH DIFFERENTIAL/PLATELET
Absolute Lymphocytes: 2010 {cells}/uL (ref 850–3900)
Absolute Monocytes: 450 {cells}/uL (ref 200–950)
Basophils Absolute: 60 {cells}/uL (ref 0–200)
Basophils Relative: 0.8 %
Eosinophils Absolute: 68 {cells}/uL (ref 15–500)
Eosinophils Relative: 0.9 %
HCT: 40.5 % (ref 39.4–51.1)
Hemoglobin: 13.3 g/dL (ref 13.2–17.1)
MCH: 27.8 pg (ref 27.0–33.0)
MCHC: 32.8 g/dL (ref 31.6–35.4)
MCV: 84.7 fL (ref 81.4–101.7)
MPV: 10.5 fL (ref 7.5–12.5)
Monocytes Relative: 6 %
Neutro Abs: 4913 {cells}/uL (ref 1500–7800)
Neutrophils Relative %: 65.5 %
Platelets: 334 Thousand/uL (ref 140–400)
RBC: 4.78 Million/uL (ref 4.20–5.80)
RDW: 14.2 % (ref 11.0–15.0)
Total Lymphocyte: 26.8 %
WBC: 7.5 Thousand/uL (ref 3.8–10.8)

## 2024-02-21 LAB — HEMOGLOBIN A1C
Hgb A1c MFr Bld: 6.5 % — ABNORMAL HIGH (ref <5.7)
Mean Plasma Glucose: 140 mg/dL
eAG (mmol/L): 7.7 mmol/L

## 2024-02-21 LAB — PSA: PSA: 5.27 ng/mL — ABNORMAL HIGH (ref <=4.00)

## 2024-02-21 LAB — COMPREHENSIVE METABOLIC PANEL WITH GFR
AG Ratio: 1.6 (calc) (ref 1.0–2.5)
ALT: 20 U/L (ref 9–46)
AST: 16 U/L (ref 10–35)
Albumin: 4.6 g/dL (ref 3.6–5.1)
Alkaline phosphatase (APISO): 58 U/L (ref 35–144)
BUN: 18 mg/dL (ref 7–25)
CO2: 28 mmol/L (ref 20–32)
Calcium: 9.8 mg/dL (ref 8.6–10.3)
Chloride: 105 mmol/L (ref 98–110)
Creat: 0.79 mg/dL (ref 0.70–1.35)
Globulin: 2.8 g/dL (ref 1.9–3.7)
Glucose, Bld: 104 mg/dL — ABNORMAL HIGH (ref 65–99)
Potassium: 4.6 mmol/L (ref 3.5–5.3)
Sodium: 143 mmol/L (ref 135–146)
Total Bilirubin: 0.8 mg/dL (ref 0.2–1.2)
Total Protein: 7.4 g/dL (ref 6.1–8.1)
eGFR: 99 mL/min/1.73m2 (ref >=60)

## 2024-02-21 LAB — LIPID PANEL
Cholesterol: 129 mg/dL (ref <200)
HDL: 64 mg/dL (ref >=40)
LDL Cholesterol (Calc): 46 mg/dL
Non-HDL Cholesterol (Calc): 65 mg/dL (ref <130)
Total CHOL/HDL Ratio: 2 (calc) (ref <5.0)
Triglycerides: 102 mg/dL (ref <150)

## 2024-02-21 LAB — VITAMIN D 25 HYDROXY (VIT D DEFICIENCY, FRACTURES): Vit D, 25-Hydroxy: 46 ng/mL (ref 30–100)

## 2024-02-28 ENCOUNTER — Other Ambulatory Visit: Payer: Self-pay | Admitting: Family Medicine

## 2024-02-28 DIAGNOSIS — E782 Mixed hyperlipidemia: Secondary | ICD-10-CM

## 2024-03-03 NOTE — Telephone Encounter (Signed)
 Requested Prescriptions  Pending Prescriptions Disp Refills   rosuvastatin  (CRESTOR ) 10 MG tablet [Pharmacy Med Name: ROSUVASTATIN  10MG  TABLETS] 90 tablet 1    Sig: TAKE 1 TABLET(10 MG) BY MOUTH DAILY     Cardiovascular:  Antilipid - Statins 2 Failed - 03/03/2024 11:52 AM      Failed - Lipid Panel in normal range within the last 12 months    Cholesterol  Date Value Ref Range Status  02/20/2024 129 <200 mg/dL Final   LDL Cholesterol (Calc)  Date Value Ref Range Status  02/20/2024 46 mg/dL (calc) Final    Comment:    Reference range: <100 . Desirable range <100 mg/dL for primary prevention;   <70 mg/dL for patients with CHD or diabetic patients  with > or = 2 CHD risk factors. SABRA LDL-C is now calculated using the Martin-Hopkins  calculation, which is a validated novel method providing  better accuracy than the Friedewald equation in the  estimation of LDL-C.  Gladis APPLETHWAITE et al. SANDREA. 7986;689(80): 2061-2068  (http://education.[facility].com/faq/FAQ164)    HDL  Date Value Ref Range Status  02/20/2024 64 > OR = 40 mg/dL Final   Triglycerides  Date Value Ref Range Status  02/20/2024 102 <150 mg/dL Final         Passed - Cr in normal range and within 360 days    Creat  Date Value Ref Range Status  02/20/2024 0.79 0.70 - 1.35 mg/dL Final   Creatinine, Urine  Date Value Ref Range Status  12/22/2020 138 20 - 320 mg/dL Final         Passed - Patient is not pregnant      Passed - Valid encounter within last 12 months    Recent Outpatient Visits           1 week ago Well adult exam   [facility] Glenard Mire, MD   6 months ago Morbid obesity [facility])   [facility] Sowles, Krichna, MD

## 2024-04-13 ENCOUNTER — Telehealth: Payer: Self-pay | Admitting: Family Medicine

## 2024-04-13 DIAGNOSIS — I82502 Chronic embolism and thrombosis of unspecified deep veins of left lower extremity: Secondary | ICD-10-CM

## 2024-04-13 NOTE — Telephone Encounter (Signed)
 Unable to pend requested pharmacy  Copied from CRM [id number]. Topic: Clinical - Prescription Issue >> Apr 13, 2024  3:20 PM Larissa RAMAN wrote: Reason for CRM: Patient states prescription for Eliquis  was not received. He is requesting to have prescription sent to:  [facility]  Phone# [phone], option # 1 Fax # [phone]

## 2024-04-15 MED ORDER — APIXABAN 5 MG PO TABS: 5.0000 mg | ORAL_TABLET | Freq: Two times a day (BID) | ORAL | 1 refills | Status: AC

## 2024-04-15 NOTE — Addendum Note (Signed)
 Addended by: YVONE WARREN BROCKS on: 04/15/2024 01:36 PM   Modules accepted: Orders

## 2024-04-15 NOTE — Telephone Encounter (Signed)
 Requested medication (s) are due for refill today: yes  Requested medication (s) are on the active medication list: yes  Last refill:    Future visit scheduled: {Yes  Notes to clinic:  Patient wants Rx sent to [facility], routing for review. Phone# [phone], option # 1 Fax # [phone]     Requested Prescriptions  Pending Prescriptions Disp Refills   apixaban  (ELIQUIS ) 5 MG TABS tablet 180 tablet 1    Sig: Take 1 tablet (5 mg total) by mouth 2 (two) times daily.     Hematology:  Anticoagulants - apixaban  Passed - 04/15/2024  8:42 AM      Passed - PLT in normal range and within 360 days    Platelets  Date Value Ref Range Status  02/20/2024 334 140 - 400 Thousand/uL Final         Passed - HGB in normal range and within 360 days    Hemoglobin  Date Value Ref Range Status  02/20/2024 13.3 13.2 - 17.1 g/dL Final         Passed - HCT in normal range and within 360 days    HCT  Date Value Ref Range Status  02/20/2024 40.5 39.4 - 51.1 % Final         Passed - Cr in normal range and within 360 days    Creat  Date Value Ref Range Status  02/20/2024 0.79 0.70 - 1.35 mg/dL Final   Creatinine, Urine  Date Value Ref Range Status  12/22/2020 138 20 - 320 mg/dL Final         Passed - AST in normal range and within 360 days    AST  Date Value Ref Range Status  02/20/2024 16 10 - 35 U/L Final         Passed - ALT in normal range and within 360 days    ALT  Date Value Ref Range Status  02/20/2024 20 9 - 46 U/L Final         Passed - Valid encounter within last 12 months    Recent Outpatient Visits           1 month ago Well adult exam   [facility] Glenard Mire, MD   7 months ago Morbid obesity [facility])   [id number] Sowles, Krichna, MD

## 2024-04-15 NOTE — Telephone Encounter (Signed)
 Unable to pend rx d/t pharmacy [place] of [place]. Appears clinic is aware from thread that pt is requesting rx. Will route at this time.     Copied from CRM #[id number]. Topic: Clinical - Prescription Issue >> Apr 15, 2024 12:21 PM Wess RAMAN wrote: Reason for CRM: Patient states his Eliquis  is coming from [place] and has to be placed 2 weeks prior so that he may receive it on time before running out.  Callback #: [phone]  Pharmacy: [facility]  Phone# [phone], option # 1 Fax # [phone]

## 2024-04-15 NOTE — Telephone Encounter (Signed)
 Lft pt vm and was faxed to # provided

## 2024-08-20 ENCOUNTER — Ambulatory Visit: Payer: PRIVATE HEALTH INSURANCE | Admitting: Family Medicine

## 2024-09-24 ENCOUNTER — Encounter: Payer: PRIVATE HEALTH INSURANCE | Admitting: Family Medicine
